# Patient Record
Sex: Female | Born: 1975 | Race: White | Hispanic: No | Marital: Single | Smoking: Current every day smoker
Health system: Southern US, Community
[De-identification: ages and names within clinical notes are randomized; demographics above are authoritative.]

## PROBLEM LIST (undated history)

## (undated) DIAGNOSIS — N289 Disorder of kidney and ureter, unspecified: Secondary | ICD-10-CM

## (undated) HISTORY — PX: APPENDECTOMY: SHX54

## (undated) HISTORY — PX: CHOLECYSTECTOMY: SHX55

---

## 2020-11-18 ENCOUNTER — Ambulatory Visit (HOSPITAL_COMMUNITY)
Admission: EM | Admit: 2020-11-18 | Discharge: 2020-11-18 | Disposition: A | Discharge status: Home / Self Care | Payer: No Payment, Other | Attending: Psychiatry | Admitting: Psychiatry

## 2020-11-18 ENCOUNTER — Other Ambulatory Visit: Payer: Self-pay

## 2020-11-18 DIAGNOSIS — F419 Anxiety disorder, unspecified: Secondary | ICD-10-CM | POA: Insufficient documentation

## 2020-11-18 DIAGNOSIS — F17210 Nicotine dependence, cigarettes, uncomplicated: Secondary | ICD-10-CM | POA: Insufficient documentation

## 2020-11-18 DIAGNOSIS — F331 Major depressive disorder, recurrent, moderate: Secondary | ICD-10-CM | POA: Insufficient documentation

## 2020-11-18 DIAGNOSIS — Z9151 Personal history of suicidal behavior: Secondary | ICD-10-CM | POA: Insufficient documentation

## 2020-11-18 NOTE — Discharge Instructions (Signed)

## 2020-11-18 NOTE — Progress Notes (Signed)
   11/18/20 2249  [facility] Triage Screening (Walk-ins at [facility] only)  How Did You Hear About [place]? Family/Friend  What Is the Reason for Your Visit/Call Today? Megan Lloyd is a 45 year old female presenting voluntary as a walk-in to [facility] requesting counseling for depression. Patient is accompanied by her daughter [name], [phone]. Patient reported counseling is long overdue. Patient reported ongoing depression for a long time and reported "bad past of childhood trauma". 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 Patient reported worsening depressive symptoms of crying spells, no confidence and inability to get out of bed. Patient reported moving from [place] to [place] 6 months ago to help daughter and son-in-law with new baby. Patient reported happiness with grandson. However patient feels that in order to be healthy that she needs outpatient mental health resources, such as therapy and medication management.  How Long Has This Been Causing You Problems? > than 6 months  Have You Recently Had Any Thoughts About Hurting Yourself? No  Are You Planning to Commit Suicide/Harm Yourself At This time? No  Have you Recently Had Thoughts About Hurting Someone Karolee Ohs? No  Are You Planning To Harm Someone At This Time? No  Are you currently experiencing any auditory, visual or other hallucinations? No  Have You Used Any Alcohol or Drugs in the Past 24 Hours? No  Do you have any current medical co-morbidities that require immediate attention? No  Clinician description of patient physical appearance/behavior: normal  What Do You Feel Would Help You the Most Today? Treatment for Depression or other mood problem  If access to [facility] was not available, would you have sought care in the Emergency Department? No  Determination of Need Routine (7 days)  Options For Referral Outpatient Therapy;Medication Management

## 2020-11-18 NOTE — ED Provider Notes (Addendum)
Behavioral Health Urgent Care Medical Screening Exam  Patient Name: Megan Lloyd MRN: [id number] Date of Evaluation: 11/18/20 Chief Complaint:  Depressed, Anxious  Diagnosis:  Final diagnoses:  MDD (major depressive disorder), recurrent episode, moderate (HCC)  Anxiety    History of Present illness: Megan Lloyd is a 45 y.o. female with past psychiatric history significant for depression and anxiety, as well as polysubstance abuse and alcohol abuse, who presents to the behavioral health urgent care as a voluntary walk-in accompanied by her daughter [name]: [phone]) for depression and anxiety.  With patient's consent, patient's daughter present during the assessment information was obtained from both the patient and patient's daughter for this assessment with patient's consent.  Patient reports that she has struggled with depression and anxiety for many years, but patient states that her depression and anxiety increased about 1 week ago when something dramatic happened at her work 1 week ago.  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  She reports that she cried yesterday on 11/17/2020 for the first time in a long time.  She reports that she used to have issues with substance abuse, in which patient states she used to abuse meth and cocaine, but patient states that she has been completely sober from all illicit drugs for the past 7 years.  Patient states that as soon as she stopped using illicit drugs 7 years ago, she then began abusing alcohol, stating that she was drinking "at least 20 shots daily" of alcohol for about 7 years.  Patient states that she has been completely sober from alcohol as well for the past 6 months now.  Patient denies any alcohol or illicit drug use currently.  She denies any history of seizures.   Patient does endorse smoking greater than or equal to 1 pack/day of cigarettes patient states that since she has been sober from drugs and alcohol, her main issues have been her depression and anxiety.  Patient denies SI currently on exam.  She denies experiencing any SI recently over the past few months.  Patient's daughter denies the patient expressing any suicidal statements to her over the past few months.  Patient reports 1 past suicide attempt 7 to 8 years ago, in which patient states that she took a gun, put the gun in her mouth, and pulled the trigger 3 times, but patient states that the gun did not fire on any of those 3 times.  Patient denies any history of self-injurious behavior via intentionally cutting or burning herself.  Patient denies HI, AVH, paranoia, or delusions.  Patient does endorse having a negative voice in her head intermittently that she refers to as negative self talk/negative self doubt and she states that this negativity is not auditory hallucinations.  Patient reports sleeping fairly, about 7 hours per night.  She endorses anhedonia as well as feelings of guilt, hopelessness, and worthlessness.  Patient states that she has been fatigued over the past few months but she states that her energy has been improving recently.  Patient endorses chronic issues with concentration and focus.  She reports decreased appetite as well as a 30 pound weight loss over the past 6 months, the patient states that this weight loss was intentional due to her being overweight in the past and she states that this weight loss was not attributed to her mental health.  Patient states that she last saw a psychiatrist about 15 to 20 years ago and she states that she was diagnosed with bipolar disorder at that time.  Patient states that she thinks she was falsely diagnosed at that time and thinks that she was diagnosed with bipolar at that time due to her substance abuse and impulsivity at that time.  Per my  chart review, I am unable to find any documented diagnosis of bipolar for the patient.  Patient endorses being easily distracted at times.  She denies any behaviors of indiscretions such as excessive spending/shopping sprees or other risky behaviors at this time.  She denies thoughts of grandiosity, but does endorse occasional flight of ideas.  Patient denies any additional symptoms of mania.  Patient does not appear to be hypomanic or manic on exam.  Patient states that she does not currently have a psychiatrist or therapist and she states that she has not taken any psychotropic medications for about 10 years.  She reports that she has taken Prozac, Wellbutrin, and Seroquel in the past, but she is unable to recall the dosages of these medications.  She states that Wellbutrin was somewhat helpful, Prozac was not helpful, and Seroquel was helpful for her mental health.  Patient states that she recently started taking St. John's wort.  She reports that she was hospitalized psychiatrically many times in the past and she states that her most recent psychiatric hospitalization was about 6 to 7 years ago in [place].  Patient currently lives in [place] with her daughter, son-in-law, and 10-month-old maternal grandson.  She states that she moved to [place] about 6 months ago from [place] in order to live with her daughter.  Patient and patient's daughter deny any access to firearms or weapons in patient's daughter's home.  Patient verbally contracts for safety with this Clinical research associate.  Psychiatric Specialty Exam  Presentation  General Appearance:Appropriate for Environment; Well Groomed  Eye Contact:Good  Speech:Clear and Coherent; Normal Rate  Speech Volume:Normal  Handedness:No data recorded  Mood and Affect  Mood:Anxious; Depressed  Affect:Congruent; Appropriate   Thought Process  Thought Processes:Coherent; Goal Directed; Linear  Descriptions of  Associations:Intact  Orientation:Full (Time, Place and Person)  Thought Content:WDL; Logical    Hallucinations:None  Ideas of Reference:None  Suicidal Thoughts:No  Homicidal Thoughts:No   Sensorium  Memory:Immediate Good; Recent Good; Remote Good  Judgment:Good  Insight:Good   Executive Functions  Concentration:Good  Attention Span:Good  Recall:Good  Fund of Knowledge:Good  Language:Good   Psychomotor Activity  Psychomotor Activity:Normal   Assets  Assets:Communication Skills; Desire for Improvement; Housing; Leisure Time; Physical Health; Resilience; Social Support   Sleep  Sleep:Fair  Number of hours:  No data recorded  No data recorded  Physical Exam: Physical Exam Vitals reviewed.  Constitutional:      General: She is not in acute distress.    Appearance: Normal appearance. She is not ill-appearing, toxic-appearing or diaphoretic.  HENT:     Head: Normocephalic and atraumatic.     Right Ear: External ear normal.     Left Ear: External ear normal.     Nose: Nose normal.  Eyes:     General:        Right eye: No discharge.        Left eye: No discharge.     Conjunctiva/sclera: Conjunctivae normal.  Cardiovascular:     Rate and Rhythm: Normal rate.  Pulmonary:     Effort: Pulmonary effort is normal. No respiratory distress.  Musculoskeletal:  General: Normal range of motion.     Cervical back: Normal range of motion.  Neurological:     General: No focal deficit present.     Mental Status: She is alert and oriented to person, place, and time.     Comments: No tremor noted.   Psychiatric:        Attention and Perception: She does not perceive auditory or visual hallucinations.        Mood and Affect: Mood is anxious and depressed.        Speech: Speech normal.        Behavior: Behavior normal. Behavior is not agitated, slowed, aggressive, withdrawn, hyperactive or combative. Behavior is cooperative.        Thought Content: Thought  content is not paranoid or delusional. Thought content does not include homicidal or suicidal ideation.     Comments: Affect mood congruent.    Review of Systems  Constitutional:  Positive for malaise/fatigue and weight loss. Negative for chills, diaphoresis and fever.  HENT:  Negative for congestion.   Respiratory:  Negative for cough and shortness of breath.   Cardiovascular:  Negative for chest pain and palpitations.  Gastrointestinal:  Negative for abdominal pain, constipation, diarrhea, nausea and vomiting.  Musculoskeletal:  Negative for joint pain and myalgias.  Neurological:  Negative for dizziness and headaches.  Psychiatric/Behavioral:  Positive for depression. Negative for hallucinations, memory loss, substance abuse and suicidal ideas. The patient is nervous/anxious. The patient does not have insomnia.   All other systems reviewed and are negative.  Vitals: Blood pressure 132/90, pulse 77, temperature 98.1 F (36.7 C), temperature source Oral, resp. rate 18, SpO2 96 %. There is no height or weight on file to calculate BMI.  Musculoskeletal: Strength & Muscle Tone: within normal limits Gait & Station: normal Patient leans: N/A   [facility] MSE Discharge Disposition for Follow up and Recommendations: Based on my evaluation the patient does not appear to have an emergency medical condition and can be discharged with resources and follow up care in outpatient services for Medication Management, Individual Therapy, and Group Therapy  Patient denies SI, HI, AVH, paranoia, or delusions.  Patient is not psychotic on exam.  Patient is not an imminent threat/risk to herself or others.  Patient does not meet inpatient psychiatric treatment criteria, overnight observation criteria, or [facility] FBC criteria.  Patient is not currently established with an outpatient psychiatrist or therapist and is not taking any psychotropic medications at this time.  Patient states that she would like to become  established with an outpatient psychiatrist and therapist and I agree with this. [place] in [place] and does not have health insurance.  Thus, patient [facility] criteria [facility].  [facility] open access/walk-in hours for psychiatry/medication management and therapy appointments in order to become established with outpatient psychiatric providers.  Patient is agreeable to going [facility] for psychiat[facility] and therapy.  [facility] Monday through Friday open access/walk-in schedule/hours provided to the patient.  Additional list of outpatient [facility] and therapy facilities provided to the patient for the patient to utilize to schedule psychiatry and therapy appointments in the case that patient is unable to [facility].  Patient verbally contracts for safety with this Clinical research associatewriter.  Patient states that if she is to return  home this evening, she will not try to harm or kill herself.  Patient's daughter does not have any safety concerns regarding the patient returning home with her at this time.  Patient's daughter states that she feels safe having the patient return home with her at this time.  Safety planning done at length with the patient and patient's daughter regarding appropriate actions to take/resources to utilize [facility], nearest ED, 911, suicide prevention Lifeline) if the patient becomes suicidal or homicidal, if the patient's condition rapidly deteriorates/worsens/does not improve, or if the patient begins to experience a mental health crisis.  Patient and patient's daughter verbalized understanding and agreement of the overall plan and recommendations, including safety plan. All patients and patient's  daughter's questions answered and concerns addressed. Patient discharged home with her daughter.   Jaclyn Shaggy, PA-C 11/18/2020, 9:51 PM

## 2020-11-20 ENCOUNTER — Ambulatory Visit (INDEPENDENT_AMBULATORY_CARE_PROVIDER_SITE_OTHER): Payer: No Payment, Other | Admitting: Physician Assistant

## 2020-11-20 ENCOUNTER — Other Ambulatory Visit: Payer: Self-pay

## 2020-11-20 ENCOUNTER — Encounter (HOSPITAL_COMMUNITY): Payer: Self-pay | Admitting: Physician Assistant

## 2020-11-20 ENCOUNTER — Ambulatory Visit (HOSPITAL_COMMUNITY): Payer: No Payment, Other | Admitting: Clinical

## 2020-11-20 VITALS — BP 137/97 | HR 68 | Ht 69.0 in | Wt 204.0 lb

## 2020-11-20 DIAGNOSIS — F3130 Bipolar disorder, current episode depressed, mild or moderate severity, unspecified: Secondary | ICD-10-CM | POA: Insufficient documentation

## 2020-11-20 DIAGNOSIS — F411 Generalized anxiety disorder: Secondary | ICD-10-CM

## 2020-11-20 DIAGNOSIS — G479 Sleep disorder, unspecified: Secondary | ICD-10-CM | POA: Diagnosis not present

## 2020-11-20 MED ORDER — HYDROXYZINE HCL 10 MG PO TABS
10.0000 mg | ORAL_TABLET | Freq: Three times a day (TID) | ORAL | 1 refills | Status: DC | PRN
Start: 2020-11-20 — End: 2020-11-27
  Filled 2020-11-20: qty 75, 25d supply, fill #0

## 2020-11-20 MED ORDER — PRAZOSIN HCL 1 MG PO CAPS
1.0000 mg | ORAL_CAPSULE | Freq: Every day | ORAL | 1 refills | Status: DC
  Filled 2020-11-20: qty 30, 30d supply, fill #0

## 2020-11-20 MED ORDER — QUETIAPINE FUMARATE 50 MG PO TABS
50.0000 mg | ORAL_TABLET | Freq: Every day | ORAL | 1 refills | Status: DC
  Filled 2020-11-20: qty 30, 30d supply, fill #0
  Filled 2020-12-23: qty 30, 30d supply, fill #1

## 2020-11-20 MED ORDER — BUPROPION HCL ER (XL) 150 MG PO TB24
150.0000 mg | ORAL_TABLET | ORAL | 1 refills | Status: DC
  Filled 2020-11-20: qty 30, 30d supply, fill #0
  Filled 2020-12-23: qty 30, 30d supply, fill #1
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
## 2020-11-23 ENCOUNTER — Encounter (HOSPITAL_COMMUNITY): Payer: Self-pay | Admitting: Physician Assistant

## 2020-11-25 ENCOUNTER — Telehealth (HOSPITAL_COMMUNITY): Payer: Self-pay | Admitting: Professional

## 2020-11-25 ENCOUNTER — Encounter (HOSPITAL_COMMUNITY): Payer: Self-pay | Admitting: Licensed Clinical Social Worker

## 2020-11-25 ENCOUNTER — Ambulatory Visit (INDEPENDENT_AMBULATORY_CARE_PROVIDER_SITE_OTHER): Payer: No Payment, Other | Admitting: Licensed Clinical Social Worker

## 2020-11-25 ENCOUNTER — Other Ambulatory Visit: Payer: Self-pay

## 2020-11-25 DIAGNOSIS — F3130 Bipolar disorder, current episode depressed, mild or moderate severity, unspecified: Secondary | ICD-10-CM
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
## 2020-11-25 NOTE — Telephone Encounter (Signed)
Cln called to f/u on referral for PHP per Megan Lloyd orients pt to group. Pt reports being unable to make it to in person 5x a week. Cln tells pt group is virtual at this time, pt then agrees to assessment. Pt cannot remember email address at this time and has grandson crying so asks for call back in about 1 hour. Cln agrees.

## 2020-11-26 ENCOUNTER — Telehealth (HOSPITAL_COMMUNITY): Payer: Self-pay | Admitting: Professional

## 2020-11-26 ENCOUNTER — Ambulatory Visit (HOSPITAL_COMMUNITY): Payer: No Payment, Other | Admitting: Professional

## 2020-11-26 DIAGNOSIS — F3130 Bipolar disorder, current episode depressed, mild or moderate severity, unspecified: Secondary | ICD-10-CM

## 2020-11-26 DIAGNOSIS — F411 Generalized anxiety disorder: Secondary | ICD-10-CM
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
## 2020-11-27 ENCOUNTER — Telehealth (HOSPITAL_COMMUNITY): Payer: Self-pay | Admitting: Professional

## 2020-11-27 ENCOUNTER — Other Ambulatory Visit: Payer: Self-pay

## 2020-11-27 ENCOUNTER — Ambulatory Visit (HOSPITAL_COMMUNITY): Payer: No Payment, Other | Admitting: Professional

## 2020-11-27 ENCOUNTER — Ambulatory Visit (HOSPITAL_COMMUNITY): Payer: No Payment, Other

## 2020-11-27 ENCOUNTER — Encounter (HOSPITAL_COMMUNITY): Payer: Self-pay | Admitting: Professional

## 2020-11-27 DIAGNOSIS — F411 Generalized anxiety disorder: Secondary | ICD-10-CM

## 2020-11-27 DIAGNOSIS — F3130 Bipolar disorder, current episode depressed, mild or moderate severity, unspecified: Secondary | ICD-10-CM

## 2020-11-27 MED ORDER — HYDROXYZINE HCL 25 MG PO TABS
25.0000 mg | ORAL_TABLET | Freq: Three times a day (TID) | ORAL | 0 refills | Status: DC | PRN
Start: 2020-11-27 — End: 2021-01-10
  Filled 2020-11-27: qty 30, 10d supply, fill #0

## 2020-11-27 NOTE — Telephone Encounter (Signed)
See call log 
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
## 2020-11-28 ENCOUNTER — Ambulatory Visit (HOSPITAL_COMMUNITY): Payer: No Payment, Other

## 2020-11-28 NOTE — Psych (Signed)
Virtual Visit via Video Note  I connected with [name] on 11/27/20 at  9:00 AM EDT by a video enabled telemedicine application and verified that I am speaking with the correct person using two identifiers.  Location: Patient: Home Provider: Clinical Home Office   I discussed the limitations of evaluation and management by telemedicine and the availability of in person appointments. The patient expressed understanding and agreed to proceed.  Follow Up Instructions:    I discussed the assessment and treatment plan with the patient. The patient was provided an opportunity to ask questions and all were answered. The patient agreed with the plan and demonstrated an understanding of the instructions.   The patient was advised to call back or seek an in-person evaluation if the symptoms worsen or if the condition fails to improve as anticipated.  I provided 32 minutes of non-face-to-face time during this encounter.   Quinn Axe, LCMHCA    Pt arrived for group in the virtual setting. Pt completed check-in: Patient rated her mood at a 5 (1 being low and 10 being high) due to anxiety about group. Pt rates current anxiety as 4 and depression as 4 (1 being low and 10 being high). Pt lost connectivity and did not return. Cln called pt and pt reports laptop died and she is unaware of how to charge it. Her daughter has helped her sign on to the virtual setting but is not home at the time. Pt denies SI/HI and reports she is going to take a nap and walk. Pt reports she will try to attend group tomorrow. Pt knows to reach out to daughter or ED if SI occurs.  Milana Na, [facility] 11/27/20

## 2020-11-29 ENCOUNTER — Telehealth (HOSPITAL_COMMUNITY): Payer: Self-pay | Admitting: Professional

## 2020-11-29 ENCOUNTER — Ambulatory Visit (HOSPITAL_COMMUNITY): Payer: No Payment, Other

## 2020-11-29 NOTE — Telephone Encounter (Signed)
See call log 

## 2020-12-02 ENCOUNTER — Ambulatory Visit (HOSPITAL_COMMUNITY): Payer: No Payment, Other

## 2020-12-03 ENCOUNTER — Ambulatory Visit (HOSPITAL_COMMUNITY): Payer: No Payment, Other

## 2020-12-04 ENCOUNTER — Ambulatory Visit (HOSPITAL_COMMUNITY): Payer: No Payment, Other

## 2020-12-04 ENCOUNTER — Other Ambulatory Visit: Payer: Self-pay
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
## 2020-12-05 ENCOUNTER — Ambulatory Visit (HOSPITAL_COMMUNITY): Payer: No Payment, Other

## 2020-12-06 ENCOUNTER — Ambulatory Visit (HOSPITAL_COMMUNITY): Payer: No Payment, Other

## 2020-12-09 ENCOUNTER — Ambulatory Visit (HOSPITAL_COMMUNITY): Payer: No Payment, Other

## 2020-12-10 ENCOUNTER — Ambulatory Visit (HOSPITAL_COMMUNITY): Payer: No Payment, Other

## 2020-12-11 ENCOUNTER — Ambulatory Visit (HOSPITAL_COMMUNITY): Payer: No Payment, Other

## 2020-12-19 ENCOUNTER — Other Ambulatory Visit: Payer: Self-pay

## 2020-12-23 ENCOUNTER — Other Ambulatory Visit: Payer: Self-pay

## 2021-01-10 ENCOUNTER — Encounter (HOSPITAL_COMMUNITY): Payer: Self-pay | Admitting: Physician Assistant

## 2021-01-10 ENCOUNTER — Other Ambulatory Visit: Payer: Self-pay

## 2021-01-10 ENCOUNTER — Telehealth (INDEPENDENT_AMBULATORY_CARE_PROVIDER_SITE_OTHER): Payer: No Payment, Other | Admitting: Physician Assistant

## 2021-01-10 DIAGNOSIS — G479 Sleep disorder, unspecified: Secondary | ICD-10-CM | POA: Diagnosis not present

## 2021-01-10 DIAGNOSIS — F3130 Bipolar disorder, current episode depressed, mild or moderate severity, unspecified: Secondary | ICD-10-CM

## 2021-01-10 DIAGNOSIS — F411 Generalized anxiety disorder: Secondary | ICD-10-CM

## 2021-01-10 MED ORDER — QUETIAPINE FUMARATE 50 MG PO TABS
50.0000 mg | ORAL_TABLET | Freq: Every day | ORAL | 1 refills | Status: DC
Start: 2021-01-10 — End: 2021-03-27
  Filled 2021-01-10 – 2021-01-23 (×2): qty 30, 30d supply, fill #0
  Filled 2021-02-24: qty 30, 30d supply, fill #1

## 2021-01-10 MED ORDER — BUPROPION HCL ER (XL) 150 MG PO TB24
150.0000 mg | ORAL_TABLET | ORAL | 1 refills | Status: DC
  Filled 2021-01-10 – 2021-01-23 (×2): qty 30, 30d supply, fill #0
  Filled 2021-02-24: qty 30, 30d supply, fill #1

## 2021-01-10 MED ORDER — HYDROXYZINE HCL 25 MG PO TABS
25.0000 mg | ORAL_TABLET | Freq: Three times a day (TID) | ORAL | 0 refills | Status: DC | PRN
  Filled 2021-01-10 – 2021-01-23 (×2): qty 30, 10d supply, fill #0

## 2021-01-10 MED ORDER — PRAZOSIN HCL 1 MG PO CAPS
1.0000 mg | ORAL_CAPSULE | Freq: Every day | ORAL | 1 refills | Status: DC
  Filled 2021-01-10 – 2021-01-23 (×2): qty 30, 30d supply, fill #0
  Filled 2021-02-24: qty 30, 30d supply, fill #1

## 2021-01-10 NOTE — Progress Notes (Addendum)
BH MD/PA/NP OP Progress Note  Virtual Visit via Telephone Note  I connected with Megan Lloyd on 01/10/21 at  4:30 PM EDT by telephone and verified that I am speaking with the correct person using two identifiers.  Location: Patient: Home Provider: Clinic   I discussed the limitations, risks, security and privacy concerns of performing an evaluation and management service by telephone and the availability of in person appointments. I also discussed with the patient that there may be a patient responsible charge related to this service. The patient expressed understanding and agreed to proceed.  Follow Up Instructions:  I discussed the assessment and treatment plan with the patient. The patient was provided an opportunity to ask questions and all were answered. The patient agreed with the plan and demonstrated an understanding of the instructions.   The patient was advised to call back or seek an in-person evaluation if the symptoms worsen or if the condition fails to improve as anticipated.  I provided 17 minutes of non-face-to-face time during this encounter.  Megan Hatchet, PA   01/10/2021 9:32 PM Megan Lloyd  MRN:  [id number]  Chief Complaint: Follow up and medication management  HPI:   Megan Lloyd is a 45 year old female with a past psychiatric history significant for bipolar disorder, sleep disturbances, and generalized anxiety disorder who presents to [facility] via virtual telephone visit for follow-up and medication management.  Patient is currently being managed on the following medications:  Seroquel 50 mg at bedtime Bupropion (Wellbutrin XL) 150 mg 24-hour tablet daily Hydroxyzine 25 mg 3 times daily as needed Prazosin 1 mg at bedtime  Patient reports that her medications seem to be working well and that she has no issues or concerns.  Patient also notes that she has limited her amount of smoking since being placed  on Wellbutrin.  She reports that she has not had a breakdown in roughly 3 weeks.  Patient still endorses depressive symptoms but attributes her depression to recently starting a new job at [employer].  Port that her anxiety has decreased a lot and has been doing much better.  Patient reports that she is able to recognize triggers in her life and handle them accordingly.  Patient denies any new stressors and and states that she has nothing negative to report.  Patient is alert and oriented x4, calm, cooperative, and fully engaged in conversation during the encounter.  Patient states that she is happy.  Patient denies suicidal or homicidal ideations.  She further denies auditory or visual hallucinations and does not appear to be responding to internal/external stimuli.  Patient endorses good sleep and receives on average 6 to 8 hours of sleep each night.  Patient endorses good appetite and eats on average 1-2 meals per day.  Patient states that she has also started incorporating exercise into her regimen.  Patient denies alcohol consumption and illicit drug use.  Patient endorses tobacco use and smokes on average half pack per day which has decreased from her usual amount.  Visit Diagnosis:    ICD-10-CM   1. Bipolar affective disorder, current episode depressed, current episode severity unspecified (HCC)  F31.30 buPROPion (WELLBUTRIN XL) 150 MG 24 hr tablet    QUEtiapine (SEROQUEL) 50 MG tablet    2. Sleep disturbances  G47.9 prazosin (MINIPRESS) 1 MG capsule    QUEtiapine (SEROQUEL) 50 MG tablet    3. Generalized anxiety disorder  F41.1 hydrOXYzine (ATARAX/VISTARIL) 25 MG tablet      Past Psychiatric  History:  Bipolar disorder Sleep disturbances Generalized anxiety disorder  Past Medical History: History reviewed. No pertinent past medical history. History reviewed. No pertinent surgical history.  Family Psychiatric History:  Patient is unsure of family psychiatric history. She states that she  never knew her mom that well but states that she knows she had a lot of issues. She reports that her father was bat-shit crazy and used to kill their pets.  Family History: History reviewed. No pertinent family history.  Social History:  Social History   Socioeconomic History   Marital status: Single    Spouse name: Not on file   Number of children: Not on file   Years of education: Not on file   Highest education level: Not on file  Occupational History   Not on file  Tobacco Use   Smoking status: Every Day    Packs/day: 1.00    Types: Cigarettes   Smokeless tobacco: Never  Substance and Sexual Activity   Alcohol use: Not Currently   Drug use: Not Currently   Sexual activity: Not Currently  Other Topics Concern   Not on file  Social History Narrative   Not on file   Social Determinants of Health   Financial Resource Strain: Low Risk    Difficulty of Paying Living Expenses: Not hard at all  Food Insecurity: No Food Insecurity   Worried About Programme researcher, broadcasting/film/video in the Last Year: Never true   Ran Out of Food in the Last Year: Never true  Transportation Needs: No Transportation Needs   Lack of Transportation (Medical): No   Lack of Transportation (Non-Medical): No  Physical Activity: Sufficiently Active   Days of Exercise per Week: 7 days   Minutes of Exercise per Session: 60 min  Stress: Stress Concern Present   Feeling of Stress : Very much  Social Connections: Socially Isolated   Frequency of Communication with Friends and Family: Three times a week   Frequency of Social Gatherings with Friends and Family: Twice a week   Attends Religious Services: Never   Database administrator or Organizations: No   Attends Engineer, structural: Never   Marital Status: Never married    Allergies: Not on File  Metabolic Disorder Labs: No results found for: HGBA1C, MPG No results found for: PROLACTIN No results found for: CHOL, TRIG, HDL, CHOLHDL, VLDL, LDLCALC No  results found for: TSH  Therapeutic Level Labs: No results found for: LITHIUM No results found for: VALPROATE No components found for:  CBMZ  Current Medications: Current Outpatient Medications  Medication Sig Dispense Refill   buPROPion (WELLBUTRIN XL) 150 MG 24 hr tablet Take 1 tablet (150 mg total) by mouth every morning. 30 tablet 1   hydrOXYzine (ATARAX/VISTARIL) 25 MG tablet Take 1 tablet (25 mg total) by mouth 3 (three) times daily as needed for anxiety. 30 tablet 0   prazosin (MINIPRESS) 1 MG capsule Take 1 capsule (1 mg total) by mouth at bedtime. 30 capsule 1   QUEtiapine (SEROQUEL) 50 MG tablet Take 1 tablet (50 mg total) by mouth at bedtime. 30 tablet 1   No current facility-administered medications for this visit.     Musculoskeletal: Strength & Muscle Tone: Unable to assess due to telemedicine visit Gait & Station: Unable to assess due to telemedicine visit Patient leans: Unable to assess due to telemedicine visit  Psychiatric Specialty Exam: Review of Systems  Psychiatric/Behavioral:  Negative for decreased concentration, dysphoric mood, hallucinations, self-injury, sleep disturbance and suicidal  ideas. The patient is not nervous/anxious and is not hyperactive.    There were no vitals taken for this visit.There is no height or weight on file to calculate BMI.  General Appearance: Unable to assess due to telemedicine visit  Eye Contact:  Unable to assess due to telemedicine visit  Speech:  Clear and Coherent and Normal Rate  Volume:  Normal  Mood:  Anxious and Euthymic  Affect:  Appropriate and Congruent  Thought Process:  Coherent and Descriptions of Associations: Intact  Orientation:  Full (Time, Place, and Person)  Thought Content: WDL   Suicidal Thoughts:  No  Homicidal Thoughts:  No  Memory:  Immediate;   Good Recent;   Good Remote;   Fair  Judgement:  Good  Insight:  Fair  Psychomotor Activity:  Restlessness  Concentration:  Concentration: Good and  Attention Span: Good  Recall:  Good  Fund of Knowledge: Good  Language: Good  Akathisia:  NA  Handed:  Right  AIMS (if indicated): not done  Assets:  Communication Skills Desire for Improvement Housing Social Support  ADL's:  Intact  Cognition: WNL  Sleep:  Good   Screenings: GAD-7    Flowsheet Row Video Visit from 01/10/2021 in [facility] Office Visit from 11/20/2020 in [facility]  Total GAD-7 Score 11 21      PHQ2-9    Flowsheet Row Video Visit from 01/10/2021 in [facility] Counselor from 11/25/2020 in [facility] Office Visit from 11/20/2020 in [facility]  PHQ-2 Total Score 3 6 4   PHQ-9 Total Score 14 26 24       Flowsheet Row Video Visit from 01/10/2021 in [facility] Counselor from 11/25/2020 in [facility] Office Visit from 11/20/2020 in [facility]  C-SSRS RISK CATEGORY Low Risk High Risk Moderate Risk        Assessment and Plan:   Megan Lloyd is a 45 year old female with a past psychiatric history significant for bipolar disorder, sleep disturbances, and generalized anxiety disorder who presents to [facility] via virtual telephone visit for follow-up and medication management.  Patient reports no issues or concerns regarding her current medication regimen.  Patient reports that her medications have been working well and that they have been effective in managing her depression and anxiety.  Patient is requesting refills on all her medications following the conclusion of the encounter.  Patient's medications to be e-prescribed to pharmacy of choice.  1. Bipolar affective disorder, current episode depressed, current episode severity unspecified (HCC)  - buPROPion (WELLBUTRIN XL) 150 MG 24 hr tablet;  Take 1 tablet (150 mg total) by mouth every morning.  Dispense: 30 tablet; Refill: 1 - QUEtiapine (SEROQUEL) 50 MG tablet; Take 1 tablet (50 mg total) by mouth at bedtime.  Dispense: 30 tablet; Refill: 1  2. Sleep disturbances  - prazosin (MINIPRESS) 1 MG capsule; Take 1 capsule (1 mg total) by mouth at bedtime.  Dispense: 30 capsule; Refill: 1 - QUEtiapine (SEROQUEL) 50 MG tablet; Take 1 tablet (50 mg total) by mouth at bedtime.  Dispense: 30 tablet; Refill: 1  3. Generalized anxiety disorder  - hydrOXYzine (ATARAX/VISTARIL) 25 MG tablet; Take 1 tablet (25 mg total) by mouth 3 (three) times daily as needed for anxiety.  Dispense: 30 tablet; Refill: 0  Patient to follow up in 2 months Provider spent a total of 17  minutes with the patient/reviewing the patient's chart  Megan Hatchet, PA 01/10/2021, 9:32 PM

## 2021-01-13 ENCOUNTER — Other Ambulatory Visit: Payer: Self-pay

## 2021-01-20 ENCOUNTER — Other Ambulatory Visit: Payer: Self-pay

## 2021-01-22 ENCOUNTER — Ambulatory Visit (INDEPENDENT_AMBULATORY_CARE_PROVIDER_SITE_OTHER): Payer: No Payment, Other | Admitting: Licensed Clinical Social Worker

## 2021-01-22 DIAGNOSIS — F3132 Bipolar disorder, current episode depressed, moderate: Secondary | ICD-10-CM

## 2021-01-23 ENCOUNTER — Other Ambulatory Visit: Payer: Self-pay
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
## 2021-02-24 ENCOUNTER — Other Ambulatory Visit: Payer: Self-pay

## 2021-02-25 ENCOUNTER — Ambulatory Visit (HOSPITAL_COMMUNITY): Payer: No Payment, Other | Admitting: Licensed Clinical Social Worker

## 2021-02-26 ENCOUNTER — Other Ambulatory Visit: Payer: Self-pay

## 2021-03-14 ENCOUNTER — Telehealth (HOSPITAL_COMMUNITY): Payer: No Payment, Other | Admitting: Physician Assistant

## 2021-03-25 ENCOUNTER — Other Ambulatory Visit: Payer: Self-pay

## 2021-03-26 ENCOUNTER — Ambulatory Visit (HOSPITAL_COMMUNITY): Payer: Self-pay | Admitting: Licensed Clinical Social Worker

## 2021-03-27 ENCOUNTER — Telehealth (INDEPENDENT_AMBULATORY_CARE_PROVIDER_SITE_OTHER): Payer: No Payment, Other | Admitting: Physician Assistant

## 2021-03-27 ENCOUNTER — Other Ambulatory Visit: Payer: Self-pay

## 2021-03-27 ENCOUNTER — Encounter (HOSPITAL_COMMUNITY): Payer: Self-pay | Admitting: Physician Assistant

## 2021-03-27 DIAGNOSIS — G479 Sleep disorder, unspecified: Secondary | ICD-10-CM

## 2021-03-27 DIAGNOSIS — F3130 Bipolar disorder, current episode depressed, mild or moderate severity, unspecified: Secondary | ICD-10-CM

## 2021-03-27 DIAGNOSIS — F411 Generalized anxiety disorder: Secondary | ICD-10-CM | POA: Diagnosis not present

## 2021-03-27 MED ORDER — QUETIAPINE FUMARATE 100 MG PO TABS
100.0000 mg | ORAL_TABLET | Freq: Every day | ORAL | 1 refills | Status: DC
  Filled 2021-03-27 – 2021-05-19 (×3): qty 30, 30d supply, fill #0

## 2021-03-27 MED ORDER — HYDROXYZINE HCL 25 MG PO TABS
25.0000 mg | ORAL_TABLET | Freq: Three times a day (TID) | ORAL | 1 refills | Status: DC | PRN
  Filled 2021-03-27: qty 75, 25d supply, fill #0

## 2021-03-27 MED ORDER — QUETIAPINE FUMARATE 50 MG PO TABS
50.0000 mg | ORAL_TABLET | Freq: Every day | ORAL | 1 refills | Status: DC
  Filled 2021-03-27 – 2021-05-19 (×3): qty 30, 30d supply, fill #0

## 2021-03-27 MED ORDER — PRAZOSIN HCL 1 MG PO CAPS
1.0000 mg | ORAL_CAPSULE | Freq: Every day | ORAL | 1 refills | Status: DC
  Filled 2021-03-27: qty 30, 30d supply, fill #0

## 2021-03-27 MED ORDER — BUPROPION HCL ER (XL) 150 MG PO TB24
150.0000 mg | ORAL_TABLET | ORAL | 1 refills | Status: DC
  Filled 2021-03-27 – 2021-05-19 (×3): qty 30, 30d supply, fill #0

## 2021-03-27 NOTE — Progress Notes (Addendum)
BH MD/PA/NP OP Progress Note  Virtual Visit via Telephone Note  I connected with Megan Lloyd on 03/27/21 at  2:00 PM EST by telephone and verified that I am speaking with the correct person using two identifiers.  Location: Patient: Virtual Provider: Secure area at work   I discussed the limitations, risks, security and privacy concerns of performing an evaluation and management service by telephone and the availability of in person appointments. I also discussed with the patient that there may be a patient responsible charge related to this service. The patient expressed understanding and agreed to proceed.  Follow Up Instructions:   I discussed the assessment and treatment plan with the patient. The patient was provided an opportunity to ask questions and all were answered. The patient agreed with the plan and demonstrated an understanding of the instructions.   The patient was advised to call back or seek an in-person evaluation if the symptoms worsen or if the condition fails to improve as anticipated.  I provided 17 minutes of non-face-to-face time during this encounter.  Meta Hatchet, PA    03/27/2021 3:04 PM Megan Lloyd  MRN:  [id number]  Chief Complaint: Follow up and medication management  HPI:   Megan Lloyd is a 45 year old female with a past psychiatric history significant for bipolar disorder, sleep disturbances, and generalized anxiety disorder who presents to [facility] via virtual telephone visit for follow-up and medication management.  Patient is currently being managed on the following medications:  Seroquel 50 mg at bedtime Bupropion (Wellbutrin XL) 150 mg 24-hour tablet daily Hydroxyzine 25 mg 3 times daily as needed Prazosin 1 mg at bedtime  Patient reports that she has been having a bit of a crisis related to family.  She reports that her son recently got hurt and tore his ACL.  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  Due to the surgery and the recovery time, patient will be out for the duration of the season.  Since receiving the news, patient reports that she has been extremely stressed and her anxiety has been very bad.  Patient rates her anxiety a 10+ out of 10.  She attributes missing 4 days of work due to her worsening anxiety and reports that she has been unable to get out of bed or eat regularly.  Patient endorses depression she attributes to her son receiving surgery on the 28th of this month.  She reports that she feels bad that she is unable to see her son due to her son attending school in [place].  Patient denies any other new stressors at this time.  A PHQ-9 screen was performed with the patient scoring an 18.  A GAD-7 screen was also performed with the patient scoring a 19.  Patient is alert and oriented x4, calm, cooperative, and fully engaged in conversation during the encounter.  Patient states that she is hopeful stating that she would like for her son to get better.  Patient denies suicidal or homicidal ideations.  She further denies auditory or visual hallucinations and does not appear to be responding to internal/external stimuli.  Patient endorses poor sleep and receives on average 3 to 4 hours of sleep each night.  Patient states that her sleep has been very disturbed and that she constantly wakes up.  Patient endorses decreased appetite and eats on average 1 meal or less each day.  Patient  denies alcohol consumption and illicit drug use.  Patient endorses tobacco use and smokes on average over a pack per day.  Visit Diagnosis:    ICD-10-CM   1. Bipolar affective disorder, current episode depressed, current episode severity unspecified (HCC)  F31.30 QUEtiapine (SEROQUEL) 100 MG tablet    buPROPion (WELLBUTRIN XL) 150 MG 24 hr tablet    2. Sleep disturbances  G47.9 QUEtiapine (SEROQUEL) 100 MG tablet     prazosin (MINIPRESS) 1 MG capsule    3. Generalized anxiety disorder  F41.1 QUEtiapine (SEROQUEL) 50 MG tablet    hydrOXYzine (ATARAX) 25 MG tablet      Past Psychiatric History:  Bipolar disorder Sleep disturbances Generalized anxiety disorder  Past Medical History: History reviewed. No pertinent past medical history. History reviewed. No pertinent surgical history.  Family Psychiatric History:  Patient is unsure of family psychiatric history. She states that she never knew her mom that well but states that she knows she had a lot of issues. She reports that her father was bat-shit crazy and used to kill their pets.  Family History: History reviewed. No pertinent family history.  Social History:  Social History   Socioeconomic History   Marital status: Single    Spouse name: Not on file   Number of children: Not on file   Years of education: Not on file   Highest education level: Not on file  Occupational History   Not on file  Tobacco Use   Smoking status: Every Day    Packs/day: 1.00    Types: Cigarettes   Smokeless tobacco: Never  Substance and Sexual Activity   Alcohol use: Not Currently   Drug use: Not Currently   Sexual activity: Not Currently  Other Topics Concern   Not on file  Social History Narrative   Not on file   Social Determinants of Health   Financial Resource Strain: Low Risk    Difficulty of Paying Living Expenses: Not hard at all  Food Insecurity: No Food Insecurity   Worried About Programme researcher, broadcasting/film/video in the Last Year: Never true   Ran Out of Food in the Last Year: Never true  Transportation Needs: No Transportation Needs   Lack of Transportation (Medical): No   Lack of Transportation (Non-Medical): No  Physical Activity: Sufficiently Active   Days of Exercise per Week: 7 days   Minutes of Exercise per Session: 60 min  Stress: Stress Concern Present   Feeling of Stress : Very much  Social Connections: Socially Isolated   Frequency of  Communication with Friends and Family: Three times a week   Frequency of Social Gatherings with Friends and Family: Twice a week   Attends Religious Services: Never   Database administrator or Organizations: No   Attends Engineer, structural: Never   Marital Status: Never married    Allergies: Not on File  Metabolic Disorder Labs: No results found for: HGBA1C, MPG No results found for: PROLACTIN No results found for: CHOL, TRIG, HDL, CHOLHDL, VLDL, LDLCALC No results found for: TSH  Therapeutic Level Labs: No results found for: LITHIUM No results found for: VALPROATE No components found for:  CBMZ  Current Medications: Current Outpatient Medications  Medication Sig Dispense Refill   QUEtiapine (SEROQUEL) 100 MG tablet Take 1 tablet (100 mg total) by mouth at bedtime. 30 tablet 1   buPROPion (WELLBUTRIN XL) 150 MG 24 hr tablet Take 1 tablet (150 mg total) by mouth every morning. 30 tablet 1  hydrOXYzine (ATARAX) 25 MG tablet Take 1 tablet (25 mg total) by mouth 3 (three) times daily as needed for anxiety. 75 tablet 1   prazosin (MINIPRESS) 1 MG capsule Take 1 capsule (1 mg total) by mouth at bedtime. 30 capsule 1   QUEtiapine (SEROQUEL) 50 MG tablet Take 1 tablet (50 mg total) by mouth daily. 30 tablet 1   No current facility-administered medications for this visit.     Musculoskeletal: Strength & Muscle Tone: Unable to assess due to telemedicine visit Gait & Station: Unable to assess due to telemedicine visit Patient leans: Unable to assess due to telemedicine visit  Psychiatric Specialty Exam: Review of Systems  Psychiatric/Behavioral:  Positive for sleep disturbance. Negative for decreased concentration, dysphoric mood, hallucinations, self-injury and suicidal ideas. The patient is nervous/anxious. The patient is not hyperactive.    There were no vitals taken for this visit.There is no height or weight on file to calculate BMI.  General Appearance: Unable to  assess due to telemedicine visit  Eye Contact:  Unable to assess due to telemedicine visit  Speech:  Clear and Coherent and Normal Rate  Volume:  Normal  Mood:  Anxious and Depressed  Affect:  Congruent and Depressed  Thought Process:  Coherent, Goal Directed, and Descriptions of Associations: Intact  Orientation:  Full (Time, Place, and Person)  Thought Content: WDL   Suicidal Thoughts:  No  Homicidal Thoughts:  No  Memory:  Immediate;   Good Recent;   Good Remote;   Fair  Judgement:  Good  Insight:  Fair  Psychomotor Activity:  Restlessness  Concentration:  Concentration: Good and Attention Span: Good  Recall:  Good  Fund of Knowledge: Good  Language: Good  Akathisia:  NA  Handed:  Right  AIMS (if indicated): not done  Assets:  Communication Skills Desire for Improvement Housing Social Support  ADL's:  Intact  Cognition: WNL  Sleep:  Fair   Screenings: GAD-7    Flowsheet Row Video Visit from 03/27/2021 in [facility] Video Visit from 01/10/2021 in [facility] Office Visit from 11/20/2020 in [facility]  Total GAD-7 Score 19 11 21       PHQ2-9    Flowsheet Row Video Visit from 03/27/2021 in [facility] Video Visit from 01/10/2021 in [facility] Counselor from 11/25/2020 in [facility] Office Visit from 11/20/2020 in [facility]  PHQ-2 Total Score 4 3 6 4   PHQ-9 Total Score 18 14 26 24       Flowsheet Row Video Visit from 03/27/2021 in [facility] Video Visit from 01/10/2021 in [facility] Counselor from 11/25/2020 in [facility]  C-SSRS RISK CATEGORY Low Risk Low Risk High Risk        Assessment and Plan:   Megan Lloyd is a 45 year old female with a past psychiatric history  significant for bipolar disorder, sleep disturbances, and generalized anxiety disorder who presents to [facility] via virtual telephone visit for follow-up and medication management.  She endorses worsening depression, sleep disturbances, and anxiety due to family related crisis.  Patient would like to adjust her medications to help her overcome this emotional obstacles she is currently contending with.  Patient was recommended adjusting her dosage of Seroquel from 50 mg to 100 mg at bedtime for the management of her mood, anxiety, and sleep disturbances.  Patient was also recommended adding on Seroquel 50 mg daily for the management of her anxiety.  Patient to continue taking all other medications as prescribed.  Patient's medications to be e-prescribed to pharmacy of choice.  1. Bipolar affective disorder, current episode depressed, current episode severity unspecified (HCC)  - QUEtiapine (SEROQUEL) 100 MG tablet; Take 1 tablet (100 mg total) by mouth at bedtime.  Dispense: 30 tablet; Refill: 1 - buPROPion (WELLBUTRIN XL) 150 MG 24 hr tablet; Take 1 tablet (150 mg total) by mouth every morning.  Dispense: 30 tablet; Refill: 1  2. Sleep disturbances  - QUEtiapine (SEROQUEL) 100 MG tablet; Take 1 tablet (100 mg total) by mouth at bedtime.  Dispense: 30 tablet; Refill: 1 - prazosin (MINIPRESS) 1 MG capsule; Take 1 capsule (1 mg total) by mouth at bedtime.  Dispense: 30 capsule; Refill: 1  3. Generalized anxiety disorder  - QUEtiapine (SEROQUEL) 50 MG tablet; Take 1 tablet (50 mg total) by mouth daily.  Dispense: 30 tablet; Refill: 1 - hydrOXYzine (ATARAX) 25 MG tablet; Take 1 tablet (25 mg total) by mouth 3 (three) times daily as needed for anxiety.  Dispense: 75 tablet; Refill: 1  Patient to follow up in 2 months Provider spent a total of 17 minutes with the patient/reviewing patient's chart  Meta Hatchet, PA 03/27/2021, 3:04 PM

## 2021-04-09 ENCOUNTER — Telehealth (HOSPITAL_COMMUNITY): Payer: Self-pay | Admitting: Licensed Clinical Social Worker

## 2021-04-09 ENCOUNTER — Ambulatory Visit (HOSPITAL_COMMUNITY): Payer: No Payment, Other | Admitting: Licensed Clinical Social Worker

## 2021-04-09 ENCOUNTER — Encounter (HOSPITAL_COMMUNITY): Payer: Self-pay

## 2021-04-09 NOTE — Telephone Encounter (Signed)
LCSW sent text message with link for video session per schedule. LCSW remained available online for session until 8:12am. Pt failed to sign on for session.

## 2021-05-08 ENCOUNTER — Other Ambulatory Visit: Payer: Self-pay

## 2021-05-15 ENCOUNTER — Ambulatory Visit (HOSPITAL_COMMUNITY): Payer: No Payment, Other | Admitting: Licensed Clinical Social Worker

## 2021-05-15 ENCOUNTER — Other Ambulatory Visit: Payer: Self-pay

## 2021-05-19 ENCOUNTER — Other Ambulatory Visit: Payer: Self-pay

## 2021-05-27 ENCOUNTER — Telehealth (INDEPENDENT_AMBULATORY_CARE_PROVIDER_SITE_OTHER): Payer: No Payment, Other | Admitting: Physician Assistant

## 2021-05-27 ENCOUNTER — Other Ambulatory Visit: Payer: Self-pay

## 2021-05-27 ENCOUNTER — Encounter (HOSPITAL_COMMUNITY): Payer: Self-pay | Admitting: Physician Assistant

## 2021-05-27 DIAGNOSIS — F3130 Bipolar disorder, current episode depressed, mild or moderate severity, unspecified: Secondary | ICD-10-CM

## 2021-05-27 DIAGNOSIS — G479 Sleep disorder, unspecified: Secondary | ICD-10-CM | POA: Diagnosis not present

## 2021-05-27 DIAGNOSIS — F411 Generalized anxiety disorder: Secondary | ICD-10-CM | POA: Diagnosis not present

## 2021-05-27 MED ORDER — BUPROPION HCL ER (XL) 150 MG PO TB24
150.0000 mg | ORAL_TABLET | ORAL | 2 refills | Status: DC
  Filled 2021-05-27 – 2021-06-10 (×2): qty 30, 30d supply, fill #0
  Filled 2021-07-09 – 2021-07-16 (×2): qty 30, 30d supply, fill #1

## 2021-05-27 MED ORDER — QUETIAPINE FUMARATE 100 MG PO TABS
100.0000 mg | ORAL_TABLET | Freq: Every day | ORAL | 2 refills | Status: DC
Start: 2021-05-27 — End: 2021-07-22
  Filled 2021-05-27 – 2021-07-16 (×3): qty 30, 30d supply, fill #0

## 2021-05-27 MED ORDER — PRAZOSIN HCL 1 MG PO CAPS
1.0000 mg | ORAL_CAPSULE | Freq: Every day | ORAL | 2 refills | Status: DC
  Filled 2021-05-27: qty 30, 30d supply, fill #0

## 2021-05-27 MED ORDER — QUETIAPINE FUMARATE 50 MG PO TABS
50.0000 mg | ORAL_TABLET | Freq: Every day | ORAL | 2 refills | Status: DC
  Filled 2021-05-27: qty 30, 30d supply, fill #0

## 2021-05-27 NOTE — Progress Notes (Addendum)
[place] MD/PA/NP OP Progress Note  Virtual Visit via Telephone Note  I connected with Megan Lloyd on 05/27/21 at  8:30 AM EST by telephone and verified that I am speaking with the correct person using two identifiers.  Location: Patient: Home Provider: Clinic   I discussed the limitations, risks, security and privacy concerns of performing an evaluation and management service by telephone and the availability of in person appointments. I also discussed with the patient that there may be a patient responsible charge related to this service. The patient expressed understanding and agreed to proceed.  Follow Up Instructions:   I discussed the assessment and treatment plan with the patient. The patient was provided an opportunity to ask questions and all were answered. The patient agreed with the plan and demonstrated an understanding of the instructions.   The patient was advised to call back or seek an in-person evaluation if the symptoms worsen or if the condition fails to improve as anticipated.  I provided 9 minutes of non-face-to-face time during this encounter.  Malachy Mood, PA    05/27/2021 1:00 PM Megan Lloyd  MRN:  [id number]  Chief Complaint:  Chief Complaint  Patient presents with   Medication Refill   Follow-up   HPI:   Megan Lloyd is a 46 year old female with a past psychiatric history significant for sleep disturbances, bipolar disorder, and generalized anxiety disorder who presents to [facility] via virtual telephone visit for follow-up and medication management.  Patient is currently being managed on the following medications:  Seroquel 100 mg at bedtime Bupropion (Wellbutrin XL) 150 mg 24-hour tablet daily Prazosin 1 mg at bedtime Seroquel 50 mg daily  Patient reports no issues or concerns regarding her current medication regimen.  Patient states that she has not been experiencing too much depression and states  that her mood is not as bad as before.  Patient reports that her son is doing better after his football injury.  She reports that he underwent surgery and is currently doing physical therapy.  Patient's son will be able to play collegiate football until October.  She reports that she will be able to see her son soon to see how he is doing.  Patient endorses anxiety and rates her anxiety at 3 or 4 out of 10.  Patient denied any other stressors at this time.  A PHQ-9 screen was performed with the patient scoring a 7.  A GAD-7 screen was also performed with the patient scoring an 11.  Patient is alert and oriented x4, calm, cooperative, and fully engaged in conversation during the encounter.  Patient states he feels calm but is sleepy due to working a late night shift the previous night.  Patient denies suicidal or homicidal ideations.  She further denies auditory or visual hallucinations and does not appear to be responding to internal/external stimuli.  Patient endorses good sleep and receives on average 8 hours of sleep each night.  Patient endorses increased appetite but still continues to eat 1 meal towards the end of the day.  Patient denies alcohol consumption and illicit drug use.  Patient endorses tobacco use and smokes on average a pack per day.  Visit Diagnosis:    ICD-10-CM   1. Sleep disturbances  G47.9 prazosin (MINIPRESS) 1 MG capsule    2. Bipolar affective disorder, current episode depressed, current episode severity unspecified ([place])  F31.30 QUEtiapine (SEROQUEL) 100 MG tablet    buPROPion (WELLBUTRIN XL) 150 MG 24 hr tablet  3. Generalized anxiety disorder  F41.1 QUEtiapine (SEROQUEL) 100 MG tablet    QUEtiapine (SEROQUEL) 50 MG tablet      Past Psychiatric History:  Bipolar disorder Sleep disturbances Generalized anxiety disorder  Past Medical History: History reviewed. No pertinent past medical history. History reviewed. No pertinent surgical history.  Family Psychiatric  History:  Patient is unsure of family psychiatric history. She states that she never knew her mom that well but states that she knows she had a lot of issues. She reports that her father was bat-shit crazy and used to kill their pets.  Family History: History reviewed. No pertinent family history.  Social History:  Social History   Socioeconomic History   Marital status: Single    Spouse name: Not on file   Number of children: Not on file   Years of education: Not on file   Highest education level: Not on file  Occupational History   Not on file  Tobacco Use   Smoking status: Every Day    Packs/day: 1.00    Types: Cigarettes   Smokeless tobacco: Never  Substance and Sexual Activity   Alcohol use: Not Currently   Drug use: Not Currently   Sexual activity: Not Currently  Other Topics Concern   Not on file  Social History Narrative   Not on file   Social Determinants of Health   Financial Resource Strain: Low Risk    Difficulty of Paying Living Expenses: Not hard at all  Food Insecurity: No Food Insecurity   Worried About Charity fundraiser in the Last Year: Never true   [place] in the Last Year: Never true  Transportation Needs: No Transportation Needs   Lack of Transportation (Medical): No   Lack of Transportation (Non-Medical): No  Physical Activity: Sufficiently Active   Days of Exercise per Week: 7 days   Minutes of Exercise per Session: 60 min  Stress: Stress Concern Present   Feeling of Stress : Very much  Social Connections: Socially Isolated   Frequency of Communication with Friends and Family: Three times a week   Frequency of Social Gatherings with Friends and Family: Twice a week   Attends Religious Services: Never   Marine scientist or Organizations: No   Attends Music therapist: Never   Marital Status: Never married    Allergies: Not on File  Metabolic Disorder Labs: No results found for: HGBA1C, MPG No results found  for: PROLACTIN No results found for: CHOL, TRIG, HDL, CHOLHDL, VLDL, LDLCALC No results found for: TSH  Therapeutic Level Labs: No results found for: LITHIUM No results found for: VALPROATE No components found for:  CBMZ  Current Medications: Current Outpatient Medications  Medication Sig Dispense Refill   buPROPion (WELLBUTRIN XL) 150 MG 24 hr tablet Take 1 tablet (150 mg total) by mouth every morning. 30 tablet 2   hydrOXYzine (ATARAX) 25 MG tablet Take 1 tablet (25 mg total) by mouth 3 (three) times daily as needed for anxiety. 75 tablet 1   prazosin (MINIPRESS) 1 MG capsule Take 1 capsule (1 mg total) by mouth at bedtime. 30 capsule 2   QUEtiapine (SEROQUEL) 100 MG tablet Take 1 tablet (100 mg total) by mouth at bedtime. 30 tablet 2   QUEtiapine (SEROQUEL) 50 MG tablet Take 1 tablet (50 mg total) by mouth daily. 30 tablet 2   No current facility-administered medications for this visit.     Musculoskeletal: Strength & Muscle Tone: Unable to assess  due to telemedicine visit [place]: Unable to assess due to telemedicine visit Patient leans: Unable to assess due to telemedicine visit  Psychiatric Specialty Exam: Review of Systems  Psychiatric/Behavioral:  Negative for decreased concentration, dysphoric mood, hallucinations, self-injury, sleep disturbance and suicidal ideas. The patient is nervous/anxious. The patient is not hyperactive.    There were no vitals taken for this visit.There is no height or weight on file to calculate BMI.  General Appearance: Unable to assess due to telemedicine visit  Eye Contact:  Unable to assess due to telemedicine visit  Speech:  Clear and Coherent and Normal Rate  Volume:  Normal  Mood:  Anxious and Euthymic  Affect:  Appropriate and Congruent  Thought Process:  Coherent and Descriptions of Associations: Intact  Orientation:  Full (Time, Place, and Person)  Thought Content: WDL   Suicidal Thoughts:  No  Homicidal Thoughts:  No   Memory:  Immediate;   Good Recent;   Good Remote;   Fair  Judgement:  Good  Insight:  Good  Psychomotor Activity:  Normal  Concentration:  Concentration: Good and Attention Span: Good  Recall:  Good  Fund of Knowledge: Good  Language: Good  Akathisia:  NA  Handed:  Right  AIMS (if indicated): not done  Assets:  Communication Skills Desire for Improvement Housing Social Support  ADL's:  Intact  Cognition: WNL  Sleep:  Good   Screenings: GAD-7    Flowsheet Row Video Visit from 05/27/2021 in [facility] 01/10/2021 in [facility] Office Visit from 11/20/2020 in [facility]  Total GAD-7 Score 11 19 11 21       PHQ2-9    Flowsheet Row Video Visit from 05/27/2021 in [facility] Video Visit from 03/27/2021 in [facility] Video Visit from 01/10/2021 in [facility] Counselor from 11/25/2020 in [facility] Office Visit from 11/20/2020 in [place]  PHQ-2 Total Score 2 4 3 6 4   PHQ-9 Total Score 7 18 14 26 24       Flowsheet Row Video Visit from 05/27/2021 in [facility] Video Visit from 03/27/2021 in [facility] Video Visit from 01/10/2021 in [place]        Assessment and Plan:   Megan Lloyd is a 46 year old female with a past psychiatric history significant for sleep disturbances, bipolar disorder, and generalized anxiety disorder who presents to [facility] via virtual telephone visit for follow-up and medication management.  Patient states that her mood has improved since her son's been  recovering from his football injury.  Patient still endorses some anxiety but appears to be more under control than the previous encounter.  Patient denies the need for dosage adjustments at this time and is requesting refills on all her medications following the conclusion of the encounter.  Patient's medications to be e-prescribed to pharmacy of choice.  1. Sleep disturbances  - prazosin (MINIPRESS) 1 MG capsule; Take 1 capsule (1 mg total) by mouth at bedtime.  Dispense: 30 capsule; Refill: 2  2. Bipolar affective disorder, current episode depressed, current episode severity unspecified ([facility])  - QUEtiapine (SEROQUEL) 100 MG tablet; Take 1 tablet (100 mg total) by mouth at  bedtime.  Dispense: 30 tablet; Refill: 2 - buPROPion (WELLBUTRIN XL) 150 MG 24 hr tablet; Take 1 tablet (150 mg total) by mouth every morning.  Dispense: 30 tablet; Refill: 2  3. Generalized anxiety disorder  - QUEtiapine (SEROQUEL) 100 MG tablet; Take 1 tablet (100 mg total) by mouth at bedtime.  Dispense: 30 tablet; Refill: 2 - QUEtiapine (SEROQUEL) 50 MG tablet; Take 1 tablet (50 mg total) by mouth daily.  Dispense: 30 tablet; Refill: 2  Patient to follow up in 2 months Provider spent a total of 9 minutes with the patient/reviewing patient's chart  Malachy Mood, PA 05/27/2021, 1:00 PM

## 2021-06-03 ENCOUNTER — Other Ambulatory Visit: Payer: Self-pay

## 2021-06-10 ENCOUNTER — Other Ambulatory Visit: Payer: Self-pay

## 2021-07-07 ENCOUNTER — Encounter (HOSPITAL_COMMUNITY): Payer: Self-pay

## 2021-07-07 ENCOUNTER — Emergency Department (HOSPITAL_COMMUNITY)
Admission: EM | Admit: 2021-07-07 | Discharge: 2021-07-07 | Disposition: A | Discharge status: Home / Self Care | Payer: Self-pay | Attending: Student | Admitting: Student

## 2021-07-07 ENCOUNTER — Emergency Department (HOSPITAL_COMMUNITY): Payer: Self-pay

## 2021-07-07 ENCOUNTER — Other Ambulatory Visit: Payer: Self-pay

## 2021-07-07 DIAGNOSIS — F17210 Nicotine dependence, cigarettes, uncomplicated: Secondary | ICD-10-CM | POA: Insufficient documentation

## 2021-07-07 DIAGNOSIS — R112 Nausea with vomiting, unspecified: Secondary | ICD-10-CM | POA: Insufficient documentation

## 2021-07-07 DIAGNOSIS — R350 Frequency of micturition: Secondary | ICD-10-CM | POA: Insufficient documentation

## 2021-07-07 DIAGNOSIS — R1032 Left lower quadrant pain: Secondary | ICD-10-CM | POA: Insufficient documentation

## 2021-07-07 HISTORY — DX: Disorder of kidney and ureter, unspecified: N28.9

## 2021-07-07 LAB — COMPREHENSIVE METABOLIC PANEL
ALT: 19 U/L (ref 0–44)
AST: 22 U/L (ref 15–41)
Albumin: 4.1 g/dL (ref 3.5–5.0)
Alkaline Phosphatase: 51 U/L (ref 38–126)
Anion gap: 9 (ref 5–15)
BUN: 13 mg/dL (ref 6–20)
CO2: 25 mmol/L (ref 22–32)
Calcium: 9.3 mg/dL (ref 8.9–10.3)
Chloride: 103 mmol/L (ref 98–111)
Creatinine, Ser: 0.67 mg/dL (ref 0.44–1.00)
GFR, Estimated: >60 mL/min (ref >60)
Glucose, Bld: 100 mg/dL — ABNORMAL HIGH (ref 70–99)
Potassium: 3.8 mmol/L (ref 3.5–5.1)
Sodium: 137 mmol/L (ref 135–145)
Total Bilirubin: 0.5 mg/dL (ref 0.3–1.2)
Total Protein: 7.6 g/dL (ref 6.5–8.1)

## 2021-07-07 LAB — CBC WITH DIFFERENTIAL/PLATELET
Abs Immature Granulocytes: 0.03 10*3/uL (ref 0.00–0.07)
Basophils Absolute: 0.1 10*3/uL (ref 0.0–0.1)
Basophils Relative: 1 %
Eosinophils Absolute: 0.2 10*3/uL (ref 0.0–0.5)
Eosinophils Relative: 2 %
HCT: 45.8 % (ref 36.0–46.0)
Hemoglobin: 15.1 g/dL — ABNORMAL HIGH (ref 12.0–15.0)
Immature Granulocytes: 0 %
Lymphocytes Relative: 32 %
Lymphs Abs: 2.7 10*3/uL (ref 0.7–4.0)
MCH: 33.6 pg (ref 26.0–34.0)
MCHC: 33 g/dL (ref 30.0–36.0)
MCV: 102 fL — ABNORMAL HIGH (ref 80.0–100.0)
Monocytes Absolute: 0.5 10*3/uL (ref 0.1–1.0)
Monocytes Relative: 6 %
Neutro Abs: 4.9 10*3/uL (ref 1.7–7.7)
Neutrophils Relative %: 59 %
Platelets: UNDETERMINED 10*3/uL (ref 150–400)
RBC: 4.49 MIL/uL (ref 3.87–5.11)
RDW: 11.9 % (ref 11.5–15.5)
WBC: 8.3 10*3/uL (ref 4.0–10.5)
nRBC: 0 % (ref 0.0–0.2)

## 2021-07-07 LAB — URINALYSIS, ROUTINE W REFLEX MICROSCOPIC
Bilirubin Urine: NEGATIVE
Glucose, UA: NEGATIVE mg/dL
Hgb urine dipstick: NEGATIVE
Ketones, ur: NEGATIVE mg/dL
Nitrite: NEGATIVE
Protein, ur: NEGATIVE mg/dL
Specific Gravity, Urine: 1.005 (ref 1.005–1.030)
pH: 5 (ref 5.0–8.0)

## 2021-07-07 LAB — I-STAT BETA HCG BLOOD, ED (MC, WL, AP ONLY): I-stat hCG, quantitative: <5 m[IU]/mL (ref <5)

## 2021-07-07 LAB — LIPASE, BLOOD: Lipase: 30 U/L (ref 11–51)

## 2021-07-07 MED ORDER — ONDANSETRON HCL 4 MG/2ML IJ SOLN
4.0000 mg | Freq: Once | INTRAMUSCULAR | Status: AC
  Administered 2021-07-07: 4 mg via INTRAVENOUS
  Filled 2021-07-07: qty 2

## 2021-07-07 MED ORDER — LIDOCAINE 4 % EX PTCH
1.0000 | MEDICATED_PATCH | Freq: Two times a day (BID) | CUTANEOUS | 0 refills | Status: DC
  Filled 2021-07-07: qty 15, 15d supply, fill #0

## 2021-07-07 MED ORDER — MORPHINE SULFATE (PF) 4 MG/ML IV SOLN
4.0000 mg | Freq: Once | INTRAVENOUS | Status: AC
  Administered 2021-07-07: 4 mg via INTRAVENOUS
  Filled 2021-07-07: qty 1

## 2021-07-07 MED ORDER — NAPROXEN 375 MG PO TABS
375.0000 mg | ORAL_TABLET | Freq: Two times a day (BID) | ORAL | 0 refills | Status: DC
  Filled 2021-07-07 – 2021-07-16 (×2): qty 20, 10d supply, fill #0

## 2021-07-07 MED ORDER — LIDOCAINE 5 % EX PTCH
1.0000 | MEDICATED_PATCH | CUTANEOUS | Status: DC
  Administered 2021-07-07: 1 via TRANSDERMAL
  Filled 2021-07-07: qty 1

## 2021-07-07 MED ORDER — KETOROLAC TROMETHAMINE 15 MG/ML IJ SOLN
15.0000 mg | Freq: Once | INTRAMUSCULAR | Status: AC
  Administered 2021-07-07: 15 mg via INTRAVENOUS
  Filled 2021-07-07: qty 1

## 2021-07-07 MED ORDER — IOHEXOL 300 MG/ML  SOLN
100.0000 mL | Freq: Once | INTRAMUSCULAR | Status: AC | PRN
  Administered 2021-07-07: 100 mL via INTRAVENOUS

## 2021-07-07 MED ORDER — LACTATED RINGERS IV BOLUS
1000.0000 mL | Freq: Once | INTRAVENOUS | Status: AC
  Administered 2021-07-07: 1000 mL via INTRAVENOUS

## 2021-07-07 NOTE — ED Notes (Signed)
Discharge instructions reviewed, questions answered. Rx education provided. Pt states understanding and no further questions. Pt ambulatory with steady gait upon discharge. No s/s of distress noted. ? ?

## 2021-07-07 NOTE — ED Notes (Signed)
Patient transported to CT 

## 2021-07-07 NOTE — ED Triage Notes (Signed)
Patient c/o LUQ pain that radiates into the back left groin area x 1 1/2 weeks. Patient also reports urinating frequently. ?Patient reports a history of kidney stones. ?

## 2021-07-07 NOTE — ED Provider Notes (Signed)
?[facility]-EMERGENCY DEPT ?Provider Note ? ?CSN: [id number] ?Arrival date & time: 07/07/21 1440 ? ?Chief Complaint(s) ?Urinary Frequency and Abdominal Pain ? ?HPI ?Megan Lloyd is a 46 y.o. female with PMH nephrolithiasis, bipolar affective disorder, generalized anxiety disorder who presents emergency department for evaluation of abdominal pain nausea vomiting.  Patient states that she has had pain in the left lower quadrant and in the left flank progressively worsening over the last 1-1/2 weeks.  Worsened significantly over the last 24 hours.  Endorses mild urinary frequency but denies chest pain, shortness of breath, headache, fever or other systemic symptoms. ? ? ?Urinary Frequency ?Associated symptoms include abdominal pain.  ?Abdominal Pain ? ?Past Medical History ?Past Medical History:  ?Diagnosis Date  ? Renal disorder   ? ?Patient Active Problem List  ? Diagnosis Date Noted  ? Bipolar affective disorder, current episode depressed (HCC) 11/20/2020  ? Generalized anxiety disorder 11/20/2020  ? Sleep disturbances 11/20/2020  ? ?Home Medication(s) ?Prior to Admission medications   ?Medication Sig Start Date End Date Taking? Authorizing Provider  ?buPROPion (WELLBUTRIN XL) 150 MG 24 hr tablet Take 1 tablet (150 mg total) by mouth every morning. 05/27/21 05/27/22  Meta Hatchet, PA  ?hydrOXYzine (ATARAX) 25 MG tablet Take 1 tablet (25 mg total) by mouth 3 (three) times daily as needed for anxiety. 03/27/21   Nwoko, Tommas Olp, PA  ?prazosin (MINIPRESS) 1 MG capsule Take 1 capsule (1 mg total) by mouth at bedtime. 05/27/21   Meta Hatchet, PA  ?QUEtiapine (SEROQUEL) 100 MG tablet Take 1 tablet (100 mg total) by mouth at bedtime. 05/27/21   Meta Hatchet, PA  ?QUEtiapine (SEROQUEL) 50 MG tablet Take 1 tablet (50 mg total) by mouth daily. 05/27/21 05/27/22  Meta Hatchet, PA  ?                                                                                                                                   ?Past Surgical History ?Past Surgical History:  ?Procedure Laterality Date  ? APPENDECTOMY    ? CHOLECYSTECTOMY    ? ?Family History ?History reviewed. No pertinent family history. ? ?Social History ?Social History  ? ?Tobacco Use  ? Smoking status: Every Day  ?  Packs/day: 1.00  ?  Types: Cigarettes  ? Smokeless tobacco: Never  ?Vaping Use  ? Vaping Use: Some days  ? Substances: Nicotine, Flavoring  ?Substance Use Topics  ? Alcohol use: Not Currently  ? Drug use: Never  ? ?Allergies ?Patient has no known allergies. ? ?Review of Systems ?Review of Systems  ?Gastrointestinal:  Positive for abdominal pain.  ?Genitourinary:  Positive for frequency.  ? ?Physical Exam ?Vital Signs  ?I have reviewed the triage vital signs ?BP (!) 183/115   Pulse 79   Temp 98.5 ?F (36.9 ?C) (Oral)   Resp 18   Ht 5\' 8"  (1.727 m)   Wt 90.7 kg  LMP 06/30/2021   SpO2 98%   BMI 30.41 kg/m?  ? ?Physical Exam ?Vitals and nursing note reviewed.  ?Constitutional:   ?   General: She is not in acute distress. ?   Appearance: She is well-developed.  ?HENT:  ?   Head: Normocephalic and atraumatic.  ?Eyes:  ?   Conjunctiva/sclera: Conjunctivae normal.  ?Cardiovascular:  ?   Rate and Rhythm: Normal rate and regular rhythm.  ?   Heart sounds: No murmur heard. ?Pulmonary:  ?   Effort: Pulmonary effort is normal. No respiratory distress.  ?   Breath sounds: Normal breath sounds.  ?Abdominal:  ?   Palpations: Abdomen is soft.  ?   Tenderness: There is abdominal tenderness in the left lower quadrant. There is left CVA tenderness.  ?Musculoskeletal:     ?   General: No swelling.  ?   Cervical back: Neck supple.  ?Skin: ?   General: Skin is warm and dry.  ?   Capillary Refill: Capillary refill takes less than 2 seconds.  ?Neurological:  ?   Mental Status: She is alert.  ?Psychiatric:     ?   Mood and Affect: Mood normal.  ? ? ?ED Results and Treatments ?Labs ?(all labs ordered are listed, but only abnormal results are displayed) ?Labs  Reviewed  ?URINALYSIS, ROUTINE W REFLEX MICROSCOPIC - Abnormal; Notable for the following components:  ?    Result Value  ? Color, Urine STRAW (*)   ? Leukocytes,Ua SMALL (*)   ? Bacteria, UA RARE (*)   ? All other components within normal limits  ?CBC WITH DIFFERENTIAL/PLATELET - Abnormal; Notable for the following components:  ? Hemoglobin 15.1 (*)   ? MCV 102.0 (*)   ? All other components within normal limits  ?COMPREHENSIVE METABOLIC PANEL - Abnormal; Notable for the following components:  ? Glucose, Bld 100 (*)   ? All other components within normal limits  ?LIPASE, BLOOD  ?I-STAT BETA HCG BLOOD, ED ([facility], [facility], [facility] ONLY)  ?                                                                                                                       ? ?Radiology ?No results found. ? ?Pertinent labs & imaging results that were available during my care of the patient were reviewed by me and considered in my medical decision making (see MDM for details). ? ?Medications Ordered in ED ?Medications - No data to display                                                               ?                                                                    ?  Procedures ?Procedures ? ?(including critical care time) ? ?Medical Decision Making / ED Course ? ? ?This patient presents to the ED for concern of left lower quadrant abdominal pain, this involves an extensive number of treatment options, and is a complaint that carries with it a high risk of complications and morbidity.  The differential diagnosis includes diverticulitis, nephrolithiasis, pyelonephritis, musculoskeletal pain ? ?MDM: ?Patient seen in the emergency department for evaluation of abdominal pain.  Physical exam reveals mild tenderness of the left lower quadrant and over the left flank.  Physical exam otherwise unremarkable.  Laboratory evaluation largely unremarkable.  Urinalysis negative.  Patient given morphine and Zofran for pain control and CT abdomen pelvis was  obtained that was reassuringly negative for diverticulitis or other acute infection in the abdomen.  On reevaluation, patient with mild persistent pain and thus was given a lidocaine patch and Toradol and on second reevaluation her pain had significantly improved.  With negative imaging and laboratory work-up, patient safe for discharge with outpatient follow-up.  Patient then discharged with a prescription for Naprosyn and lidocaine patch. ? ? ?Additional history obtained: ? ?-External records from outside source obtained and reviewed including: Chart review including previous notes, labs, imaging, consultation notes ? ? ?Lab Tests: ?-I ordered, reviewed, and interpreted labs.   ?The pertinent results include:   ?Labs Reviewed  ?URINALYSIS, ROUTINE W REFLEX MICROSCOPIC - Abnormal; Notable for the following components:  ?    Result Value  ? Color, Urine STRAW (*)   ? Leukocytes,Ua SMALL (*)   ? Bacteria, UA RARE (*)   ? All other components within normal limits  ?CBC WITH DIFFERENTIAL/PLATELET - Abnormal; Notable for the following components:  ? Hemoglobin 15.1 (*)   ? MCV 102.0 (*)   ? All other components within normal limits  ?COMPREHENSIVE METABOLIC PANEL - Abnormal; Notable for the following components:  ? Glucose, Bld 100 (*)   ? All other components within normal limits  ?LIPASE, BLOOD  ?I-STAT BETA HCG BLOOD, ED ([facility], [facility], [facility] ONLY)  ?  ? ? ?Imaging Studies ordered: ?I ordered imaging studies including CTAP ?I independently visualized and interpreted imaging. ?I agree with the radiologist interpretation ? ? ?Medicines ordered and prescription drug management: ?No orders of the defined types were placed in this encounter. ?  ?-I have reviewed the patients home medicines and have made adjustments as needed ? ?Critical interventions ?none ? ? ?Cardiac Monitoring: ?The patient was maintained on a cardiac monitor.  I personally viewed and interpreted the cardiac monitored which showed an underlying rhythm of:  NSr ? ?Social Determinants of Health:  ?Factors impacting patients care include: none ? ? ?Reevaluation: ?After the interventions noted above, I reevaluated the patient and found that they have :improved ? ?Co morbidities

## 2021-07-07 NOTE — ED Provider Triage Note (Signed)
Emergency Medicine Provider Triage Evaluation Note ? ?Megan Lloyd , a 46 y.o. female  was evaluated in triage.  Pt complains of left side abdominal pain that has been ongoing for 12 days intermittently. The patient states that the pain has been more severe over the past 3 days. She has intermittent diarrhea and nausea but no emesis. The pain does extend to the left CVA area. Patient has hx of kidney stones and states pain is similar. Patient has had appendectomy.  ? ?Review of Systems  ?Positive: LLQ abd pain, diarrhea, urinary frequency ?Negative: Dysuria, emesis, shortness of breath ? ?Physical Exam  ?BP (!) 170/95 (BP Location: Left Arm)   Pulse 91   Temp 98.5 ?F (36.9 ?C) (Oral)   Resp 18   Ht 5\' 8"  (1.727 m)   Wt 90.7 kg   LMP 06/30/2021   SpO2 95%   BMI 30.41 kg/m?  ?Gen:   Awake, no distress   ?Resp:  Normal effort  ?MSK:   Moves extremities without difficulty  ?Other:  LLQ tenderness ? ?Medical Decision Making  ?Medically screening exam initiated at 3:19 PM.  Appropriate orders placed.  Megan Lloyd was informed that the remainder of the evaluation will be completed by another provider, this initial triage assessment does not replace that evaluation, and the importance of remaining in the ED until their evaluation is complete. ? ? ?  ?[name], PA-C ?07/07/21 1521 ? ?

## 2021-07-08 ENCOUNTER — Other Ambulatory Visit: Payer: Self-pay

## 2021-07-09 ENCOUNTER — Other Ambulatory Visit: Payer: Self-pay

## 2021-07-15 ENCOUNTER — Other Ambulatory Visit: Payer: Self-pay

## 2021-07-16 ENCOUNTER — Other Ambulatory Visit: Payer: Self-pay

## 2021-07-17 ENCOUNTER — Other Ambulatory Visit: Payer: Self-pay

## 2021-07-22 ENCOUNTER — Telehealth (INDEPENDENT_AMBULATORY_CARE_PROVIDER_SITE_OTHER): Payer: No Payment, Other | Admitting: Physician Assistant

## 2021-07-22 ENCOUNTER — Encounter (HOSPITAL_COMMUNITY): Payer: Self-pay | Admitting: Physician Assistant

## 2021-07-22 DIAGNOSIS — F411 Generalized anxiety disorder: Secondary | ICD-10-CM | POA: Diagnosis not present

## 2021-07-22 DIAGNOSIS — F3130 Bipolar disorder, current episode depressed, mild or moderate severity, unspecified: Secondary | ICD-10-CM | POA: Diagnosis not present

## 2021-07-22 DIAGNOSIS — G479 Sleep disorder, unspecified: Secondary | ICD-10-CM | POA: Diagnosis not present

## 2021-07-22 MED ORDER — QUETIAPINE FUMARATE 100 MG PO TABS
100.0000 mg | ORAL_TABLET | Freq: Every day | ORAL | 1 refills | Status: DC
  Filled 2021-07-22 – 2021-08-25 (×2): qty 30, 30d supply, fill #0
  Filled 2021-09-23: qty 30, 30d supply, fill #1

## 2021-07-22 MED ORDER — QUETIAPINE FUMARATE 25 MG PO TABS
25.0000 mg | ORAL_TABLET | Freq: Every day | ORAL | 1 refills | Status: DC
  Filled 2021-07-22 – 2021-08-25 (×2): qty 30, 30d supply, fill #0
  Filled 2021-09-23: qty 30, 30d supply, fill #1

## 2021-07-22 MED ORDER — PRAZOSIN HCL 2 MG PO CAPS
2.0000 mg | ORAL_CAPSULE | Freq: Every day | ORAL | 1 refills | Status: DC
  Filled 2021-07-22: qty 30, 30d supply, fill #0

## 2021-07-22 MED ORDER — BUPROPION HCL ER (XL) 150 MG PO TB24
150.0000 mg | ORAL_TABLET | ORAL | 1 refills | Status: DC
Start: 2021-07-22 — End: 2021-09-23
  Filled 2021-07-22 – 2021-08-25 (×2): qty 30, 30d supply, fill #0
  Filled 2021-09-23: qty 30, 30d supply, fill #1

## 2021-07-22 NOTE — Progress Notes (Signed)
BH MD/PA/NP OP Progress Note ? ?Virtual Visit via Telephone Note ? ?I connected with Megan Lloyd on 07/22/21 at  2:30 PM EDT by telephone and verified that I am speaking with the correct person using two identifiers. ? ?Location: ?Patient: Home ?Provider: Clinic ?  ?I discussed the limitations, risks, security and privacy concerns of performing an evaluation and management service by telephone and the availability of in person appointments. I also discussed with the patient that there may be a patient responsible charge related to this service. The patient expressed understanding and agreed to proceed. ? ?Follow Up Instructions: ? ?I discussed the assessment and treatment plan with the patient. The patient was provided an opportunity to ask questions and all were answered. The patient agreed with the plan and demonstrated an understanding of the instructions. ?  ?The patient was advised to call back or seek an in-person evaluation if the symptoms worsen or if the condition fails to improve as anticipated. ? ?I provided 17 minutes of non-face-to-face time during this encounter. ? ?Malachy Mood, PA ? ? ?07/22/2021 10:53 PM ?Megan Lloyd  ?MRN:  [id number] ? ?Chief Complaint:  ?Chief Complaint  ?Patient presents with  ? Follow-up  ? Medication Management  ? ?HPI:  ? ?Megan Lloyd is a 46 year old female with a past psychiatric history significant for anxiety disorder, bipolar disorder, and sleep disturbances who presents to [facility] via virtual telephone visit for follow-up and medication management.  Patient is currently being managed on the following medications: ? ?Prazosin 1 mg at bedtime ?Seroquel 100 mg at bedtime ?Bupropion (Wellbutrin XL) 150 mg 24-hour tablet daily ?Seroquel 50 mg daily ? ?Patient reports that her father recently passed away.  She expresses that she does not know how to feel regarding the news due to her past relationship with her father  being rocky.  Patient reports that she did not attend his funeral and states that her sister met with her for a few days for support.  In regards to her depression, patient states that although she experiences depressive episodes nearly every day, she reports that her mood is getting better.  Patient's depressive episodes are characterized by the following: disorientation, losing her bearings, being lost in thoughts, difficulty concentrating, and being unable to stay on task. ? ?Patient endorses anxiety and rates her anxiety an 8 out of 10.  Patient's main stressor revolves around the recent passing of her father.  She expresses that she is unsure how to feel but states that she does feel different and conflicted regarding the news.  Patient denies neglecting her activities of daily living and states that she is able to get out of the house to run errands.  She continues to endorse issues with sleep.  A PHQ-9 screen was performed with the patient scoring a 16.  A GAD-7 screen was also performed with the patient scoring a 20. ? ?Patient is alert and oriented x4, calm, cooperative, and fully engaged in conversation during the encounter.  Patient reports feeling mentally exhausted.  Patient denies suicidal or homicidal ideations.  She further denies auditory or visual hallucinations and does not appear to be responding to internal/external stimuli.  Patient endorses poor sleep and receives on average 4 hours of sleep.  Patient reports that she has crazy and vivid dreams often.  Patient endorses binge eating on occasion.  On average, patient eats 2 small meals per day.  Patient endorses alcohol consumption occasionally.  Patient endorses tobacco use and  smokes on average a pack per day.  Patient denies illicit drug use. ? ?Visit Diagnosis:  ?  ICD-10-CM   ?1. Generalized anxiety disorder  F41.1 QUEtiapine (SEROQUEL) 25 MG tablet  ?  QUEtiapine (SEROQUEL) 100 MG tablet  ?  ?2. Bipolar affective disorder, current episode  depressed, current episode severity unspecified ([place])  F31.30 buPROPion (WELLBUTRIN XL) 150 MG 24 hr tablet  ?  QUEtiapine (SEROQUEL) 100 MG tablet  ?  ?3. Sleep disturbances  G47.9 prazosin (MINIPRESS) 2 MG capsule  ?  ? ? ?Past Psychiatric History:  ?Bipolar disorder ?Sleep disturbances ?Generalized anxiety disorder ? ?Past Medical History:  ?Past Medical History:  ?Diagnosis Date  ? Renal disorder   ?  ?Past Surgical History:  ?Procedure Laterality Date  ? APPENDECTOMY    ? CHOLECYSTECTOMY    ? ? ?Family Psychiatric History:  ?Patient is unsure of family psychiatric history. She states that she never knew her mom that well but states that she knows she had a lot of issues. She reports that her father was bat-shit crazy and used to kill their pets. ? ?Family History: History reviewed. No pertinent family history. ? ?Social History:  ?Social History  ? ?Socioeconomic History  ? Marital status: Single  ?  Spouse name: Not on file  ? Number of children: Not on file  ? Years of education: Not on file  ? Highest education level: Not on file  ?Occupational History  ? Not on file  ?Tobacco Use  ? Smoking status: Every Day  ?  Packs/day: 1.00  ?  Types: Cigarettes  ? Smokeless tobacco: Never  ?Vaping Use  ? Vaping Use: Some days  ? Substances: Nicotine, Flavoring  ?Substance and Sexual Activity  ? Alcohol use: Not Currently  ? Drug use: Never  ? Sexual activity: Not Currently  ?Other Topics Concern  ? Not on file  ?Social History Narrative  ? Not on file  ? ?Social Determinants of Health  ? ?Financial Resource Strain: Low Risk   ? Difficulty of Paying Living Expenses: Not hard at all  ?Food Insecurity: No Food Insecurity  ? Worried About Charity fundraiser in the Last Year: Never true  ? Ran Out of Food in the Last Year: Never true  ?Transportation Needs: No Transportation Needs  ? Lack of Transportation (Medical): No  ? Lack of Transportation (Non-Medical): No  ?Physical Activity: Sufficiently Active  ? Days of Exercise  per Week: 7 days  ? Minutes of Exercise per Session: 60 min  ?Stress: Stress Concern Present  ? Feeling of Stress : Very much  ?Social Connections: Socially Isolated  ? Frequency of Communication with Friends and Family: Three times a week  ? Frequency of Social Gatherings with Friends and Family: Twice a week  ? Attends Religious Services: Never  ? Active Member of Clubs or Organizations: No  ? Attends Archivist Meetings: Never  ? Marital Status: Never married  ? ? ?Allergies: No Known Allergies ? ?Metabolic Disorder Labs: ?No results found for: HGBA1C, MPG ?No results found for: PROLACTIN ?No results found for: CHOL, TRIG, HDL, CHOLHDL, VLDL, LDLCALC ?No results found for: TSH ? ?Therapeutic Level Labs: ?No results found for: LITHIUM ?No results found for: VALPROATE ?No components found for:  CBMZ ? ?Current Medications: ?Current Outpatient Medications  ?Medication Sig Dispense Refill  ? buPROPion (WELLBUTRIN XL) 150 MG 24 hr tablet Take 1 tablet (150 mg total) by mouth every morning. 30 tablet 1  ? hydrOXYzine (ATARAX)  25 MG tablet Take 1 tablet (25 mg total) by mouth 3 (three) times daily as needed for anxiety. 75 tablet 1  ? Lidocaine (SALONPAS PAIN RELIEVING) 4 % PTCH Apply 1 each topically every 12 (twelve) hours. 15 patch 0  ? naproxen (NAPROSYN) 375 MG tablet Take 1 tablet (375 mg total) by mouth 2 (two) times daily. 20 tablet 0  ? prazosin (MINIPRESS) 2 MG capsule Take 1 capsule (2 mg total) by mouth at bedtime. 30 capsule 1  ? QUEtiapine (SEROQUEL) 100 MG tablet Take 1 tablet (100 mg total) by mouth at bedtime. 30 tablet 1  ? QUEtiapine (SEROQUEL) 25 MG tablet Take 1 tablet (25 mg total) by mouth daily. 30 tablet 1  ? ?No current facility-administered medications for this visit.  ? ? ? ?Musculoskeletal: ?Strength & Muscle Tone: Unable to assess due to telemedicine visit ?Gait & Station: Unable to assess due to telemedicine visit ?Patient leans: Unable to assess due to telemedicine  visit ? ?Psychiatric Specialty Exam: ?Review of Systems  ?Psychiatric/Behavioral:  Positive for sleep disturbance. Negative for decreased concentration, dysphoric mood, hallucinations, self-injury and suicidal idea

## 2021-07-23 ENCOUNTER — Other Ambulatory Visit: Payer: Self-pay

## 2021-07-30 ENCOUNTER — Other Ambulatory Visit: Payer: Self-pay

## 2021-08-25 ENCOUNTER — Other Ambulatory Visit: Payer: Self-pay

## 2021-08-26 ENCOUNTER — Other Ambulatory Visit: Payer: Self-pay

## 2021-08-27 ENCOUNTER — Other Ambulatory Visit: Payer: Self-pay

## 2021-08-29 ENCOUNTER — Other Ambulatory Visit: Payer: Self-pay

## 2021-09-09 ENCOUNTER — Other Ambulatory Visit: Payer: Self-pay

## 2021-09-09 MED ORDER — LORAZEPAM 2 MG PO TABS
ORAL_TABLET | ORAL | 0 refills | Status: DC
  Filled 2021-09-09: qty 2, 1d supply, fill #0

## 2021-09-09 MED ORDER — AMOXICILLIN 875 MG PO TABS
ORAL_TABLET | ORAL | 0 refills | Status: DC
Start: 2021-09-09 — End: 2023-04-28
  Filled 2021-09-09: qty 10, 5d supply, fill #0

## 2021-09-09 MED ORDER — IBUPROFEN 600 MG PO TABS
600.0000 mg | ORAL_TABLET | Freq: Four times a day (QID) | ORAL | 0 refills | Status: DC | PRN
  Filled 2021-09-09: qty 20, 5d supply, fill #0

## 2021-09-10 ENCOUNTER — Other Ambulatory Visit: Payer: Self-pay

## 2021-09-10 MED ORDER — HYDROCODONE-ACETAMINOPHEN 7.5-325 MG PO TABS
1.0000 | ORAL_TABLET | Freq: Four times a day (QID) | ORAL | 0 refills | Status: DC | PRN
  Filled 2021-09-10: qty 5, 2d supply, fill #0

## 2021-09-23 ENCOUNTER — Telehealth (INDEPENDENT_AMBULATORY_CARE_PROVIDER_SITE_OTHER): Payer: No Payment, Other | Admitting: Physician Assistant

## 2021-09-23 ENCOUNTER — Other Ambulatory Visit: Payer: Self-pay

## 2021-09-23 DIAGNOSIS — F3130 Bipolar disorder, current episode depressed, mild or moderate severity, unspecified: Secondary | ICD-10-CM

## 2021-09-23 DIAGNOSIS — F411 Generalized anxiety disorder: Secondary | ICD-10-CM | POA: Diagnosis not present

## 2021-09-23 DIAGNOSIS — G479 Sleep disorder, unspecified: Secondary | ICD-10-CM

## 2021-09-23 MED ORDER — QUETIAPINE FUMARATE 100 MG PO TABS
100.0000 mg | ORAL_TABLET | Freq: Every day | ORAL | 3 refills | Status: DC
  Filled 2021-09-23 – 2021-11-12 (×2): qty 30, 30d supply, fill #0
  Filled 2021-12-10 – 2021-12-17 (×2): qty 30, 30d supply, fill #1
  Filled 2022-01-12: qty 30, 30d supply, fill #2
  Filled 2022-02-12: qty 30, 30d supply, fill #3

## 2021-09-23 MED ORDER — BUPROPION HCL ER (XL) 150 MG PO TB24
150.0000 mg | ORAL_TABLET | ORAL | 3 refills | Status: DC
  Filled 2021-09-23 – 2021-11-12 (×2): qty 30, 30d supply, fill #0
  Filled 2021-12-10 – 2021-12-17 (×2): qty 30, 30d supply, fill #1
  Filled 2022-01-12: qty 30, 30d supply, fill #2
  Filled 2022-02-12: qty 30, 30d supply, fill #3

## 2021-09-23 MED ORDER — QUETIAPINE FUMARATE 25 MG PO TABS
25.0000 mg | ORAL_TABLET | Freq: Every day | ORAL | 3 refills | Status: DC
  Filled 2021-09-23: qty 30, 30d supply, fill #0

## 2021-09-23 MED ORDER — PRAZOSIN HCL 2 MG PO CAPS
2.0000 mg | ORAL_CAPSULE | Freq: Every day | ORAL | 3 refills | Status: DC
  Filled 2021-09-23: qty 30, 30d supply, fill #0

## 2021-09-23 NOTE — Progress Notes (Signed)
BH MD/PA/NP OP Progress Note  Virtual Visit via Telephone Note  I connected with Megan Lloyd on 09/26/21 at  2:30 PM EDT by telephone and verified that I am speaking with the correct person using two identifiers.  Location: Patient: Home Provider: Clinic   I discussed the limitations, risks, security and privacy concerns of performing an evaluation and management service by telephone and the availability of in person appointments. I also discussed with the patient that there may be a patient responsible charge related to this service. The patient expressed understanding and agreed to proceed.  Follow Up Instructions:  I discussed the assessment and treatment plan with the patient. The patient was provided an opportunity to ask questions and all were answered. The patient agreed with the plan and demonstrated an understanding of the instructions.   The patient was advised to call back or seek an in-person evaluation if the symptoms worsen or if the condition fails to improve as anticipated.  I provided 11 minutes of non-face-to-face time during this encounter.  Meta Hatchet, PA   09/26/2021 10:42 AM Megan Lloyd  MRN:  [id number]  Chief Complaint:  Chief Complaint  Patient presents with   Follow-up   Medication Refill   HPI:   Megan Lloyd is a 46 year old female with a past psychiatric history significant for anxiety disorder, bipolar disorder, and sleep disturbances who presents to [facility] via virtual telephone visit for follow-up and medication management.  Patient is currently being managed on the following medications:  Prazosin 1 mg at bedtime Seroquel 100 mg at bedtime Bupropion (Wellbutrin XL) 150 mg 24-hour tablet daily Seroquel 50 mg daily  Patient presents to the encounter not appearing to be in any acute distress.  She reports that she recently had all her teeth pulled and will be fitted for dentures in the  coming months.  Patient states that she feels more normal since having her teeth pulled.  In regards to her medications, patient admits to days where she did not take her medications and noticed a huge difference in her mood without them.  Patient is regularly taking her medications and states that her depressive episodes have not been too overbearing.  Patient states that she has been working a lot and keeping herself busy to avoid depressive episodes.  Patient endorses anxiety and rates her anxiety as 7 out of 10.  She reports being worried lately but is unable to pinpoint why.  A PHQ-9 screen was performed the patient scoring a 6.  A GAD-7 screen was also performed with the patient scoring a 9.  Patient is alert and oriented x4, calm, cooperative, and fully engaged in conversation during the encounter.  Patient endorses good mood and states that she feels happy.  Patient denies suicidal or homicidal ideations.  Patient denies auditory or visual hallucinations and does not appear to be responding to internal/external stimuli.  Patient endorses good sleep and receives on average 8 hours of sleep each night.  Patient endorses fair appetite and eats on average 1-2 meals per day.  Patient denies alcohol consumption and illicit drug use.  Patient endorses tobacco use and smokes on average half pack per day.  Visit Diagnosis:    ICD-10-CM   1. Generalized anxiety disorder  F41.1 QUEtiapine (SEROQUEL) 25 MG tablet    QUEtiapine (SEROQUEL) 100 MG tablet    2. Bipolar affective disorder, current episode depressed, current episode severity unspecified (HCC)  F31.30 QUEtiapine (SEROQUEL) 100 MG tablet  buPROPion (WELLBUTRIN XL) 150 MG 24 hr tablet    3. Sleep disturbances  G47.9 prazosin (MINIPRESS) 2 MG capsule      Past Psychiatric History:  Bipolar disorder Sleep disturbances Generalized anxiety disorder  Past Medical History:  Past Medical History:  Diagnosis Date   Renal disorder     Past  Surgical History:  Procedure Laterality Date   APPENDECTOMY     CHOLECYSTECTOMY      Family Psychiatric History:  Patient is unsure of family psychiatric history. She states that she never knew her mom that well but states that she knows she had a lot of issues. She reports that her father was bat-shit crazy and used to kill their pets.  Family History: History reviewed. No pertinent family history.  Social History:  Social History   Socioeconomic History   Marital status: Single    Spouse name: Not on file   Number of children: Not on file   Years of education: Not on file   Highest education level: Not on file  Occupational History   Not on file  Tobacco Use   Smoking status: Every Day    Packs/day: 1.00    Types: Cigarettes   Smokeless tobacco: Never  Vaping Use   Vaping Use: Some days   Substances: Nicotine, Flavoring  Substance and Sexual Activity   Alcohol use: Not Currently   Drug use: Never   Sexual activity: Not Currently  Other Topics Concern   Not on file  Social History Narrative   Not on file   Social Determinants of Health   Financial Resource Strain: Low Risk  (11/25/2020)   Overall Financial Resource Strain (CARDIA)    Difficulty of Paying Living Expenses: Not hard at all  Food Insecurity: No Food Insecurity (11/25/2020)   Hunger Vital Sign    Worried About Running Out of Food in the Last Year: Never true    Ran Out of Food in the Last Year: Never true  Transportation Needs: No Transportation Needs (11/25/2020)   PRAPARE - Administrator, Civil Service (Medical): No    Lack of Transportation (Non-Medical): No  Physical Activity: Sufficiently Active (11/25/2020)   Exercise Vital Sign    Days of Exercise per Week: 7 days    Minutes of Exercise per Session: 60 min  Stress: Stress Concern Present (11/25/2020)   [employer] of Occupational Health - Occupational Stress Questionnaire    Feeling of Stress : Very much  Social Connections:  Socially Isolated (11/25/2020)   Social Connection and Isolation Panel [NHANES]    Frequency of Communication with Friends and Family: Three times a week    Frequency of Social Gatherings with Friends and Family: Twice a week    Attends Religious Services: Never    Database administrator or Organizations: No    Attends Engineer, structural: Never    Marital Status: Never married    Allergies: No Known Allergies  Metabolic Disorder Labs: No results found for: "HGBA1C", "MPG" No results found for: "PROLACTIN" No results found for: "CHOL", "TRIG", "HDL", "CHOLHDL", "VLDL", "LDLCALC" No results found for: "TSH"  Therapeutic Level Labs: No results found for: "LITHIUM" No results found for: "VALPROATE" No results found for: "CBMZ"  Current Medications: Current Outpatient Medications  Medication Sig Dispense Refill   amoxicillin (AMOXIL) 875 MG tablet TAKE 1 TABLET BY MOUTH EVERY 12 HOURS DAILY FOR 5 DAYS. 10 tablet 0   buPROPion (WELLBUTRIN XL) 150 MG 24 hr tablet Take  1 tablet (150 mg total) by mouth once every morning. 30 tablet 3   HYDROcodone-acetaminophen (NORCO) 7.5-325 MG tablet TAKE 1 TABLET EVERY 6 HOURS AS NEEDED. 5 tablet 0   hydrOXYzine (ATARAX) 25 MG tablet Take 1 tablet (25 mg total) by mouth 3 (three) times daily as needed for anxiety. 75 tablet 1   ibuprofen (ADVIL) 600 MG tablet TAKE 1 TABLET BY MOUTH EVERY 6 HOURS AS NEEDED. 20 tablet 0   Lidocaine (SALONPAS PAIN RELIEVING) 4 % PTCH Apply 1 each topically every 12 (twelve) hours. 15 patch 0   naproxen (NAPROSYN) 375 MG tablet Take 1 tablet (375 mg total) by mouth 2 (two) times daily. 20 tablet 0   prazosin (MINIPRESS) 2 MG capsule Take 1 capsule (2 mg total) by mouth once nightly at bedtime. 30 capsule 3   QUEtiapine (SEROQUEL) 100 MG tablet Take 1 tablet (100 mg total) by mouth once nightly at bedtime. 30 tablet 3   QUEtiapine (SEROQUEL) 25 MG tablet Take 1 tablet (25 mg total) by mouth once daily. 30 tablet 3    No current facility-administered medications for this visit.     Musculoskeletal: Strength & Muscle Tone: Unable to assess due to telemedicine visit Gait & Station: Unable to assess due to telemedicine visit Patient leans: Unable to assess due to telemedicine visit  Psychiatric Specialty Exam: Review of Systems  Psychiatric/Behavioral:  Positive for sleep disturbance. Negative for decreased concentration, dysphoric mood, hallucinations, self-injury and suicidal ideas. The patient is nervous/anxious. The patient is not hyperactive.     There were no vitals taken for this visit.There is no height or weight on file to calculate BMI.  General Appearance: Unable to assess due to telemedicine visit  Eye Contact:  Unable to assess due to telemedicine visit  Speech:  Clear and Coherent and Normal Rate  Volume:  Normal  Mood:  Anxious and Depressed  Affect:  Congruent and Depressed  Thought Process:  Coherent and Descriptions of Associations: Intact  Orientation:  Full (Time, Place, and Person)  Thought Content: WDL   Suicidal Thoughts:  No  Homicidal Thoughts:  No  Memory:  Immediate;   Good Recent;   Good Remote;   Fair  Judgement:  Good  Insight:  Good  Psychomotor Activity:  Normal  Concentration:  Concentration: Good and Attention Span: Good  Recall:  Good  Fund of Knowledge: Good  Language: Good  Akathisia:  No  Handed:  Right  AIMS (if indicated): not done  Assets:  Communication Skills Desire for Improvement Housing Social Support Vocational/Educational  ADL's:  Intact  Cognition: WNL  Sleep:  Fair   Screenings: GAD-7    Flowsheet Row [facility]  Total GAD-7 Score 9 20 11 19 11       PHQ2-9    Flowsheet Row [facility]  PHQ-2 Total Score 2 4 2 4 3   PHQ-9 Total Score 6 16 7 18  14  Flowsheet Row Video Visit from 09/23/2021 in [facility] Video Visit from 07/22/2021 in [facility] ED from 07/07/2021 in [facility]  C-SSRS RISK CATEGORY Low Risk Low Risk No Risk        Assessment and Plan:   Megan Lloyd is a 46 year old female with a past psychiatric history significant for anxiety disorder, bipolar disorder, and sleep disturbances who presents to [facility] via virtual telephone visit for follow-up and medication management.  Patient reports that her depression has been minimal and states that she has been working and staying busy to avoid worsening symptoms.  Patient endorses elevated anxiety but is unable to attribute any discernible triggers.  Patient would like to continue taking her medications as prescribed with no dosage adjustments needed.  Patient's medications to be e-prescribed to pharmacy of choice.  Collaboration of Care: Collaboration of Care: Medication Management AEB provider managing patient's psychiatric medications, Psychiatrist AEB patient being followed by a mental health provider, and Referral or follow-up with counselor/therapist AEB patient being seen by a licensed clinical social worker  Patient/Guardian was advised Release of Information must be obtained prior to any record release in order to collaborate their  care with an outside provider. Patient/Guardian was advised if they have not already done so to contact the registration department to sign all necessary forms in order for [place] to release information regarding their care.   Consent: Patient/Guardian gives verbal consent for treatment and assignment of benefits for services provided during this visit. Patient/Guardian expressed understanding and agreed to proceed.   1. Generalized anxiety disorder  - QUEtiapine (SEROQUEL) 25 MG tablet; Take 1 tablet (25 mg total) by mouth once daily.  Dispense: 30 tablet; Refill: 3 - QUEtiapine (SEROQUEL) 100 MG tablet; Take 1 tablet (100 mg total) by mouth once nightly at bedtime.  Dispense: 30 tablet; Refill: 3  2. Bipolar affective disorder, current episode depressed, current episode severity unspecified (HCC)  - QUEtiapine (SEROQUEL) 100 MG tablet; Take 1 tablet (100 mg total) by mouth once nightly at bedtime.  Dispense: 30 tablet; Refill: 3 - buPROPion (WELLBUTRIN XL) 150 MG 24 hr tablet; Take 1 tablet (150 mg total) by mouth once every morning.  Dispense: 30 tablet; Refill: 3  3. Sleep disturbances  - prazosin (MINIPRESS) 2 MG capsule; Take 1 capsule (2 mg total) by mouth once nightly at bedtime.  Dispense: 30 capsule; Refill: 3  Patient to follow up in 3 months Provider spent a total of 11 minutes with the patient/reviewing patient's chart  Meta Hatchet, PA 09/26/2021, 10:42 AM

## 2021-09-24 ENCOUNTER — Other Ambulatory Visit: Payer: Self-pay

## 2021-09-25 ENCOUNTER — Encounter (HOSPITAL_COMMUNITY): Payer: Self-pay | Admitting: Physician Assistant

## 2021-10-01 ENCOUNTER — Other Ambulatory Visit: Payer: Self-pay

## 2021-11-12 ENCOUNTER — Other Ambulatory Visit: Payer: Self-pay

## 2021-12-10 ENCOUNTER — Other Ambulatory Visit: Payer: Self-pay

## 2021-12-17 ENCOUNTER — Other Ambulatory Visit: Payer: Self-pay

## 2021-12-23 ENCOUNTER — Telehealth (HOSPITAL_COMMUNITY): Payer: No Payment, Other | Admitting: Physician Assistant

## 2021-12-23 ENCOUNTER — Encounter (HOSPITAL_COMMUNITY): Payer: Self-pay

## 2022-01-12 ENCOUNTER — Other Ambulatory Visit: Payer: Self-pay

## 2022-02-12 ENCOUNTER — Other Ambulatory Visit: Payer: Self-pay

## 2022-02-25 ENCOUNTER — Ambulatory Visit (HOSPITAL_COMMUNITY): Payer: No Payment, Other | Admitting: Licensed Clinical Social Worker

## 2022-08-18 IMAGING — CT CT ABD-PELV W/ CM
2 of 5 series · 16 of 46 positions shown, 18 images · IV contrast (agent unspecified)
Comparison: None.

CLINICAL DATA: Left lower quadrant abdominal pain.

EXAM:
CT ABDOMEN AND PELVIS WITH CONTRAST
TECHNIQUE: Multidetector CT imaging of the abdomen and pelvis was performed
using the standard protocol following bolus administration of
intravenous contrast.

[Series 2: axial st · axial · 0.76mm/px · z∈[+1174,+1579]mm · 13 of 95 slices shown, 15 images]
[im 7/95  soft-tissue]
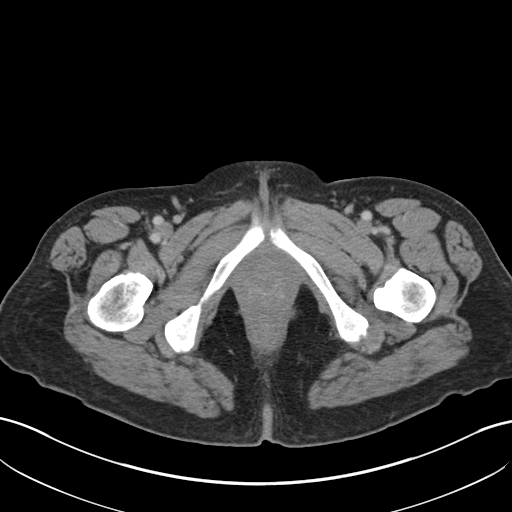
[im 7/95  bone]
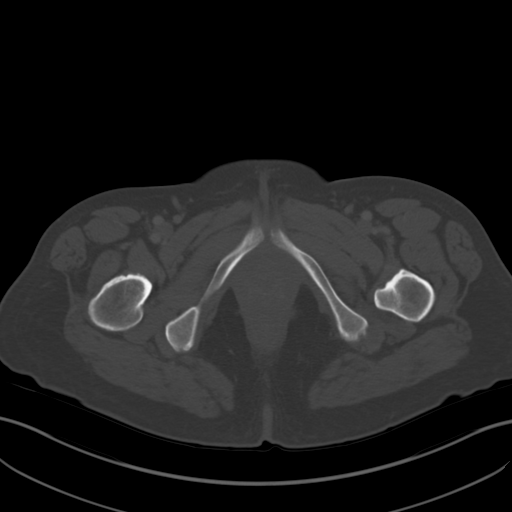
[im 13/95  soft-tissue]
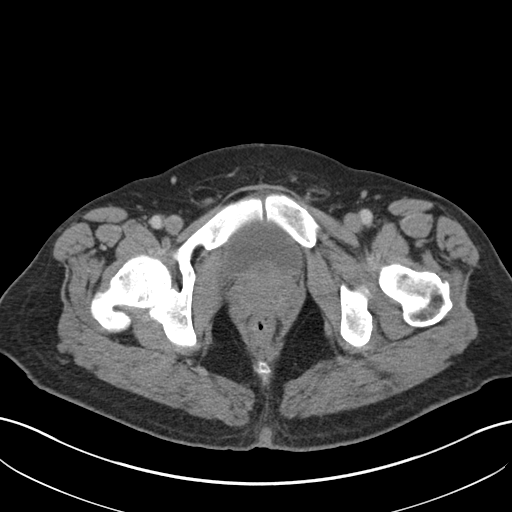
[im 19/95  soft-tissue]
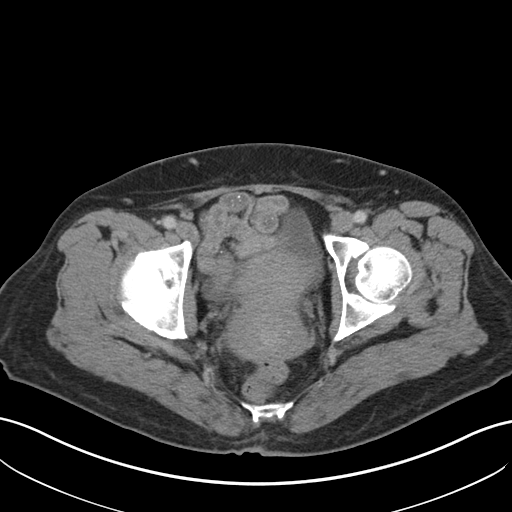
[im 26/95  soft-tissue]
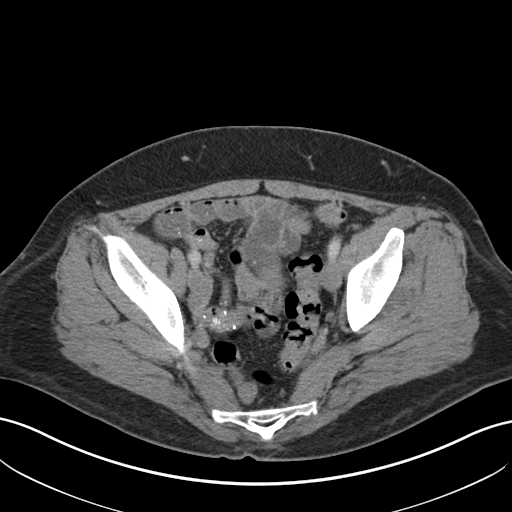
[im 32/95  soft-tissue]
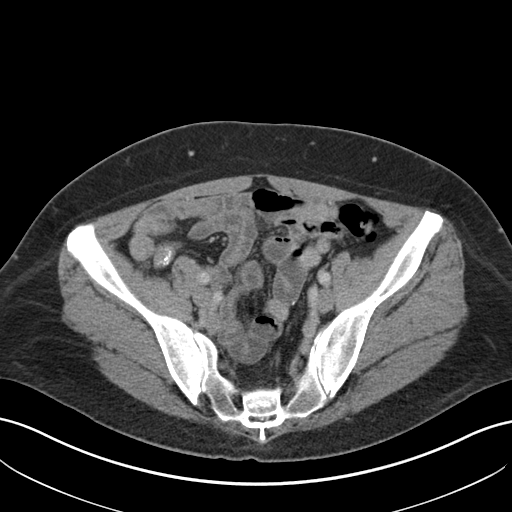
[im 38/95  soft-tissue]
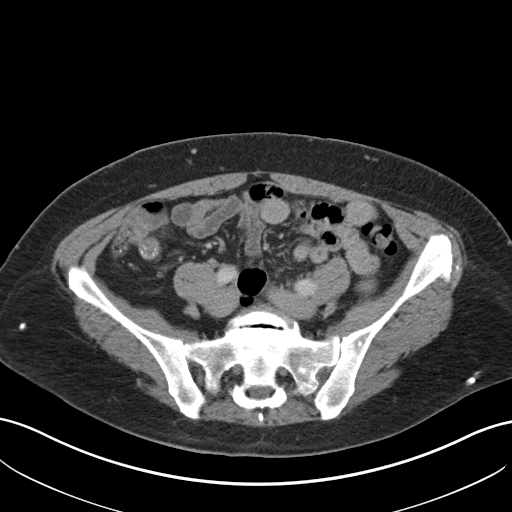
[im 51/95  soft-tissue]
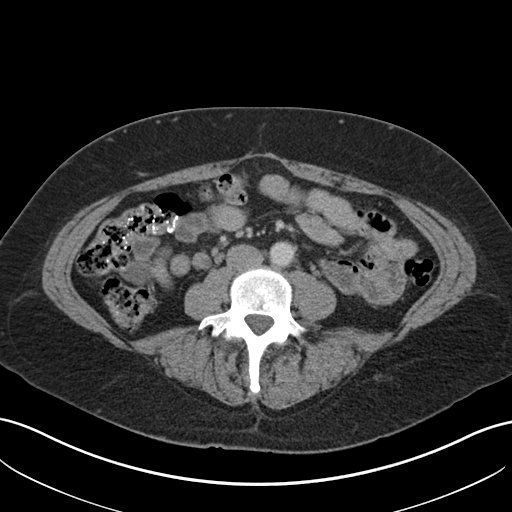
[im 57/95  soft-tissue]
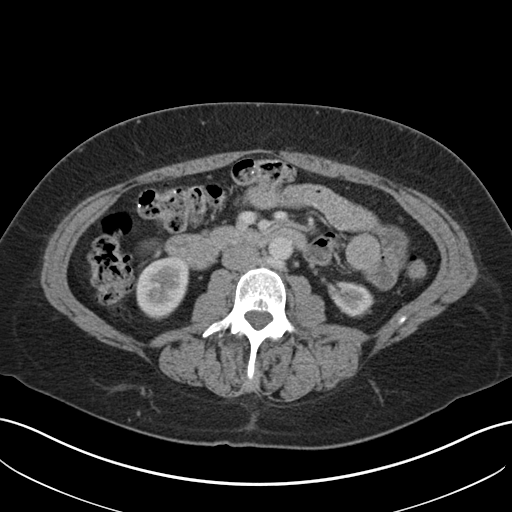
[im 63/95  soft-tissue]
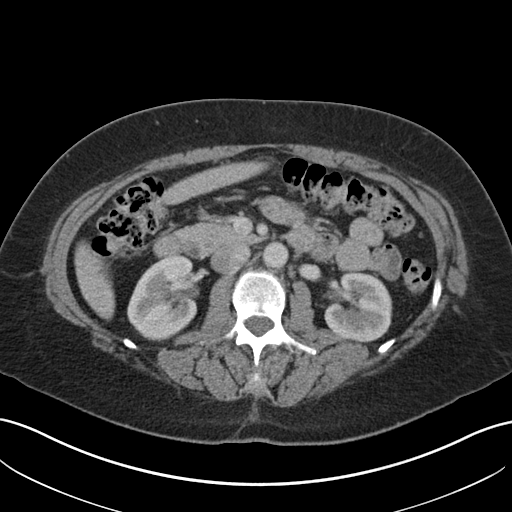
[im 63/95  bone]
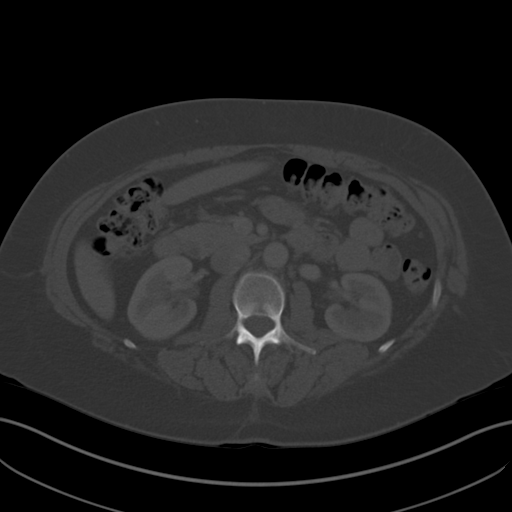
[im 69/95  soft-tissue]
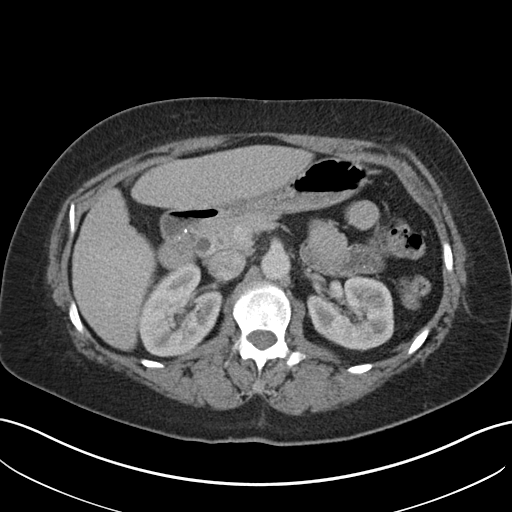
[im 76/95  soft-tissue]
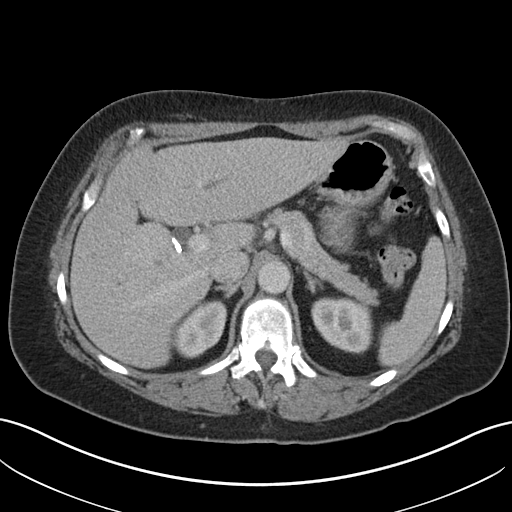
[im 82/95  soft-tissue]
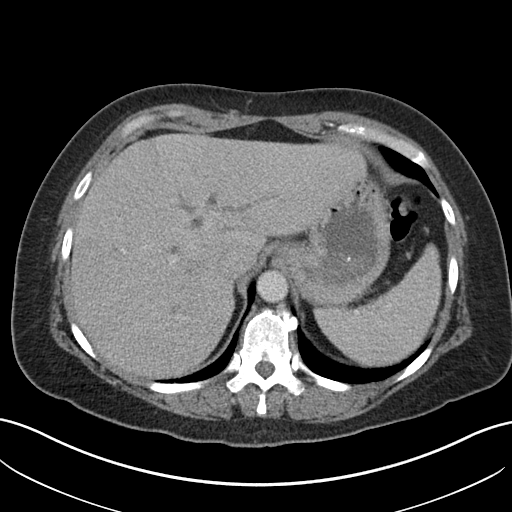
[im 88/95  soft-tissue]
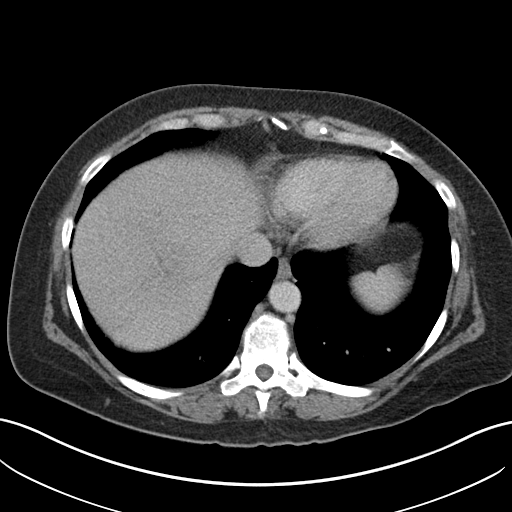

[Series 5: coronal st · coronal · 0.78mm/px · 3 of 137 slices shown]
[im 46/137  soft-tissue]
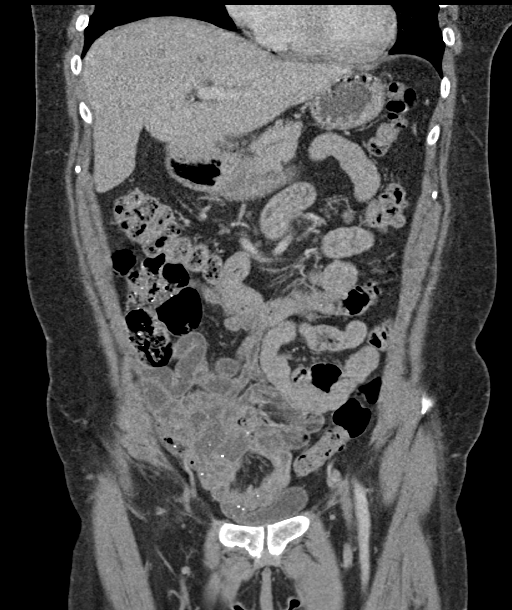
[im 61/137  soft-tissue]
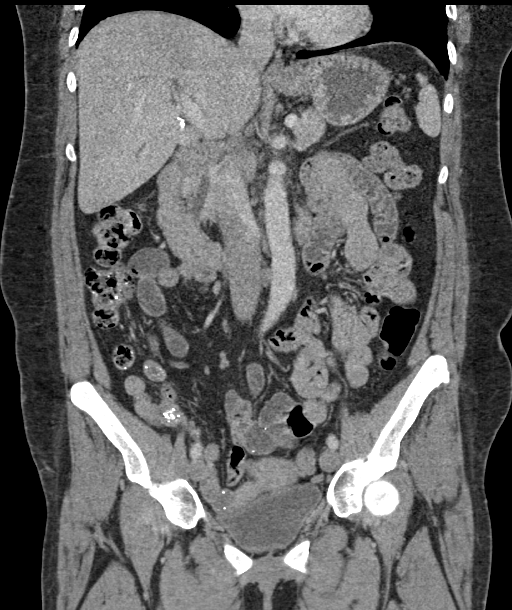
[im 76/137  soft-tissue]
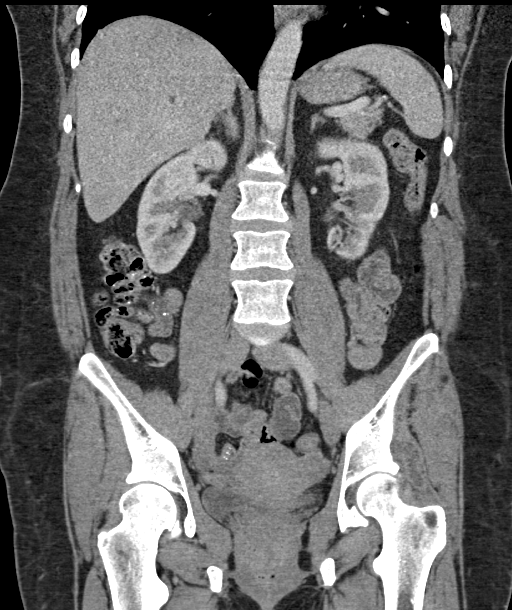

[16 of 46 positions shown; findings below may reference images not displayed]

RADIATION DOSE REDUCTION: This exam was performed according to the
departmental dose-optimization program which includes automated
exposure control, adjustment of the mA and/or kV according to
patient size and/or use of iterative reconstruction technique.

CONTRAST:  100mL OMNIPAQUE IOHEXOL 300 MG/ML  SOLN
FINDINGS: Lower chest: There is a 5 mm nodule at the right lung base
posteriorly. The visualized lung bases are otherwise clear.

No intra-abdominal free air or free fluid.

Hepatobiliary: The liver is unremarkable. There is mild intrahepatic
biliary dilatation. Cholecystectomy. No retained calcified stone
noted in the central CBD.

Pancreas: Unremarkable. No pancreatic ductal dilatation or
surrounding inflammatory changes.

Spleen: Normal in size without focal abnormality.

Adrenals/Urinary Tract: The adrenal glands unremarkable. Punctate
nonobstructing left renal inferior pole calculus. No hydronephrosis.
The right kidney, visualized ureters, and urinary bladder appear
unremarkable.

Stomach/Bowel: There is no bowel obstruction or active inflammation.
Appendectomy.

Vascular/Lymphatic: The abdominal aorta and IVC are unremarkable.
There is a retroaortic left renal vein anatomy. No portal venous
gas. There is no adenopathy.

Reproductive: The uterus is anteverted.  No adnexal masses.

Other: None

Musculoskeletal: Mild degenerative changes of the spine. No acute
osseous pathology.
IMPRESSION: 1. No acute intra-abdominal or pelvic pathology.
2. Punctate nonobstructing left renal inferior pole calculus. No
hydronephrosis.

## 2023-04-27 ENCOUNTER — Encounter (HOSPITAL_COMMUNITY): Payer: Self-pay

## 2023-04-27 ENCOUNTER — Telehealth (HOSPITAL_COMMUNITY): Payer: Self-pay

## 2023-04-27 ENCOUNTER — Ambulatory Visit (HOSPITAL_COMMUNITY): Payer: Medicaid Other

## 2023-04-27 DIAGNOSIS — F1421 Cocaine dependence, in remission: Secondary | ICD-10-CM

## 2023-04-27 DIAGNOSIS — F1221 Cannabis dependence, in remission: Secondary | ICD-10-CM

## 2023-04-27 DIAGNOSIS — F4312 Post-traumatic stress disorder, chronic: Secondary | ICD-10-CM

## 2023-04-27 DIAGNOSIS — F1521 Other stimulant dependence, in remission: Secondary | ICD-10-CM

## 2023-04-27 DIAGNOSIS — F411 Generalized anxiety disorder: Secondary | ICD-10-CM

## 2023-04-27 DIAGNOSIS — F1020 Alcohol dependence, uncomplicated: Secondary | ICD-10-CM

## 2023-04-27 DIAGNOSIS — F1591 Other stimulant use, unspecified, in remission: Secondary | ICD-10-CM

## 2023-04-27 DIAGNOSIS — F3130 Bipolar disorder, current episode depressed, mild or moderate severity, unspecified: Secondary | ICD-10-CM
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
## 2023-04-28 ENCOUNTER — Ambulatory Visit (HOSPITAL_COMMUNITY)
Admission: EM | Admit: 2023-04-28 | Discharge: 2023-04-29 | Payer: Medicaid Other | Attending: Psychiatry | Admitting: Psychiatry

## 2023-04-28 ENCOUNTER — Other Ambulatory Visit: Payer: Self-pay

## 2023-04-28 ENCOUNTER — Encounter (HOSPITAL_COMMUNITY): Payer: Self-pay

## 2023-04-28 DIAGNOSIS — F445 Conversion disorder with seizures or convulsions: Secondary | ICD-10-CM | POA: Insufficient documentation

## 2023-04-28 DIAGNOSIS — F1221 Cannabis dependence, in remission: Secondary | ICD-10-CM | POA: Insufficient documentation

## 2023-04-28 DIAGNOSIS — F1090 Alcohol use, unspecified, uncomplicated: Secondary | ICD-10-CM

## 2023-04-28 DIAGNOSIS — F3112 Bipolar disorder, current episode manic without psychotic features, moderate: Secondary | ICD-10-CM | POA: Insufficient documentation

## 2023-04-28 DIAGNOSIS — F4310 Post-traumatic stress disorder, unspecified: Secondary | ICD-10-CM

## 2023-04-28 DIAGNOSIS — Z3202 Encounter for pregnancy test, result negative: Secondary | ICD-10-CM | POA: Diagnosis not present

## 2023-04-28 DIAGNOSIS — F410 Panic disorder [episodic paroxysmal anxiety] without agoraphobia: Secondary | ICD-10-CM | POA: Insufficient documentation

## 2023-04-28 DIAGNOSIS — F1020 Alcohol dependence, uncomplicated: Secondary | ICD-10-CM | POA: Insufficient documentation

## 2023-04-28 DIAGNOSIS — F411 Generalized anxiety disorder: Secondary | ICD-10-CM | POA: Insufficient documentation

## 2023-04-28 DIAGNOSIS — F4312 Post-traumatic stress disorder, chronic: Secondary | ICD-10-CM | POA: Insufficient documentation

## 2023-04-28 DIAGNOSIS — I517 Cardiomegaly: Secondary | ICD-10-CM | POA: Insufficient documentation

## 2023-04-28 LAB — CBC WITH DIFFERENTIAL/PLATELET
Abs Immature Granulocytes: 0.02 10*3/uL (ref 0.00–0.07)
Basophils Absolute: 0 10*3/uL (ref 0.0–0.1)
Basophils Relative: 1 %
Eosinophils Absolute: 0.1 10*3/uL (ref 0.0–0.5)
Eosinophils Relative: 1 %
HCT: 50 % — ABNORMAL HIGH (ref 36.0–46.0)
Hemoglobin: 16.8 g/dL — ABNORMAL HIGH (ref 12.0–15.0)
Immature Granulocytes: 0 %
Lymphocytes Relative: 21 %
Lymphs Abs: 1.8 10*3/uL (ref 0.7–4.0)
MCH: 34.7 pg — ABNORMAL HIGH (ref 26.0–34.0)
MCHC: 33.6 g/dL (ref 30.0–36.0)
MCV: 103.3 fL — ABNORMAL HIGH (ref 80.0–100.0)
Monocytes Absolute: 0.6 10*3/uL (ref 0.1–1.0)
Monocytes Relative: 7 %
Neutro Abs: 6.2 10*3/uL (ref 1.7–7.7)
Neutrophils Relative %: 70 %
Platelets: UNDETERMINED 10*3/uL (ref 150–400)
RBC: 4.84 MIL/uL (ref 3.87–5.11)
RDW: 11.7 % (ref 11.5–15.5)
Smear Review: UNDETERMINED
WBC: 8.8 10*3/uL (ref 4.0–10.5)
nRBC: 0 % (ref 0.0–0.2)

## 2023-04-28 LAB — URINALYSIS, ROUTINE W REFLEX MICROSCOPIC
Bacteria, UA: NONE SEEN
Bilirubin Urine: NEGATIVE
Glucose, UA: NEGATIVE mg/dL
Hgb urine dipstick: NEGATIVE
Ketones, ur: NEGATIVE mg/dL
Nitrite: NEGATIVE
Protein, ur: NEGATIVE mg/dL
Specific Gravity, Urine: 1.004 — ABNORMAL LOW (ref 1.005–1.030)
pH: 8 (ref 5.0–8.0)

## 2023-04-28 LAB — COMPREHENSIVE METABOLIC PANEL
ALT: 74 U/L — ABNORMAL HIGH (ref 0–44)
AST: 57 U/L — ABNORMAL HIGH (ref 15–41)
Albumin: 4.4 g/dL (ref 3.5–5.0)
Alkaline Phosphatase: 66 U/L (ref 38–126)
Anion gap: 14 (ref 5–15)
BUN: 10 mg/dL (ref 6–20)
CO2: 28 mmol/L (ref 22–32)
Calcium: 10.2 mg/dL (ref 8.9–10.3)
Chloride: 98 mmol/L (ref 98–111)
Creatinine, Ser: 0.8 mg/dL (ref 0.44–1.00)
GFR, Estimated: >60 mL/min (ref >60)
Glucose, Bld: 109 mg/dL — ABNORMAL HIGH (ref 70–99)
Potassium: 3.4 mmol/L — ABNORMAL LOW (ref 3.5–5.1)
Sodium: 140 mmol/L (ref 135–145)
Total Bilirubin: 1.2 mg/dL (ref 0.0–1.2)
Total Protein: 7.9 g/dL (ref 6.5–8.1)

## 2023-04-28 LAB — LIPID PANEL
Cholesterol: 273 mg/dL — ABNORMAL HIGH (ref 0–200)
HDL: 74 mg/dL (ref >40)
LDL Cholesterol: 163 mg/dL — ABNORMAL HIGH (ref 0–99)
Total CHOL/HDL Ratio: 3.7 {ratio}
Triglycerides: 181 mg/dL — ABNORMAL HIGH (ref <150)
VLDL: 36 mg/dL (ref 0–40)

## 2023-04-28 LAB — PREGNANCY, URINE: Preg Test, Ur: NEGATIVE

## 2023-04-28 LAB — MAGNESIUM: Magnesium: 1.7 mg/dL (ref 1.7–2.4)

## 2023-04-28 LAB — TSH: TSH: 0.753 u[IU]/mL (ref 0.350–4.500)

## 2023-04-28 LAB — POCT URINE DRUG SCREEN - MANUAL ENTRY (I-SCREEN)
POC Amphetamine UR: NOT DETECTED
POC Buprenorphine (BUP): NOT DETECTED
POC Cocaine UR: NOT DETECTED
POC Marijuana UR: POSITIVE — AB
POC Methadone UR: NOT DETECTED
POC Methamphetamine UR: NOT DETECTED
POC Morphine: NOT DETECTED
POC Oxazepam (BZO): NOT DETECTED
POC Oxycodone UR: NOT DETECTED
POC Secobarbital (BAR): NOT DETECTED

## 2023-04-28 LAB — HEPATITIS PANEL, ACUTE
HCV Ab: REACTIVE — AB
Hep A IgM: NONREACTIVE
Hep B C IgM: NONREACTIVE
Hepatitis B Surface Ag: NONREACTIVE

## 2023-04-28 LAB — ETHANOL: Alcohol, Ethyl (B): <10 mg/dL (ref <10)

## 2023-04-28 LAB — HEMOGLOBIN A1C
Hgb A1c MFr Bld: 4.8 % (ref 4.8–5.6)
Mean Plasma Glucose: 91.06 mg/dL

## 2023-04-28 LAB — POCT PREGNANCY, URINE: Preg Test, Ur: NEGATIVE

## 2023-04-28 MED ORDER — HALOPERIDOL LACTATE 5 MG/ML IJ SOLN: 10.0000 mg | Freq: Three times a day (TID) | INTRAMUSCULAR | Status: DC | PRN

## 2023-04-28 MED ORDER — ARIPIPRAZOLE 5 MG PO TABS
5.0000 mg | ORAL_TABLET | Freq: Once | ORAL | Status: AC
  Administered 2023-04-29: 5 mg via ORAL
  Filled 2023-04-28: qty 1

## 2023-04-28 MED ORDER — ZIPRASIDONE MESYLATE 20 MG IM SOLR: 20.0000 mg | Freq: Once | INTRAMUSCULAR | Status: DC

## 2023-04-28 MED ORDER — TRAZODONE HCL 50 MG PO TABS
50.0000 mg | ORAL_TABLET | Freq: Every evening | ORAL | Status: DC | PRN
  Administered 2023-04-28: 50 mg via ORAL
  Filled 2023-04-28: qty 1

## 2023-04-28 MED ORDER — NICOTINE 21 MG/24HR TD PT24
21.0000 mg | MEDICATED_PATCH | Freq: Every day | TRANSDERMAL | Status: DC
Start: 2023-04-28 — End: 2023-04-29
  Administered 2023-04-28 – 2023-04-29 (×2): 21 mg via TRANSDERMAL
  Filled 2023-04-28 (×2): qty 1

## 2023-04-28 MED ORDER — LORAZEPAM 1 MG PO TABS: 1.0000 mg | ORAL_TABLET | Freq: Two times a day (BID) | ORAL | Status: DC

## 2023-04-28 MED ORDER — LORAZEPAM 1 MG PO TABS
1.0000 mg | ORAL_TABLET | Freq: Three times a day (TID) | ORAL | Status: DC
  Filled 2023-04-28: qty 1

## 2023-04-28 MED ORDER — LORAZEPAM 2 MG/ML IJ SOLN: 2.0000 mg | Freq: Once | INTRAMUSCULAR | Status: DC

## 2023-04-28 MED ORDER — LORAZEPAM 1 MG PO TABS: 1.0000 mg | ORAL_TABLET | Freq: Every day | ORAL | Status: DC

## 2023-04-28 MED ORDER — ADULT MULTIVITAMIN W/MINERALS CH
1.0000 | ORAL_TABLET | Freq: Every day | ORAL | Status: DC
  Administered 2023-04-28: 1 via ORAL
  Filled 2023-04-28 (×2): qty 1

## 2023-04-28 MED ORDER — HALOPERIDOL 5 MG PO TABS: 5.0000 mg | ORAL_TABLET | Freq: Three times a day (TID) | ORAL | Status: DC | PRN

## 2023-04-28 MED ORDER — HALOPERIDOL LACTATE 5 MG/ML IJ SOLN: 5.0000 mg | Freq: Three times a day (TID) | INTRAMUSCULAR | Status: DC | PRN

## 2023-04-28 MED ORDER — MAGNESIUM HYDROXIDE 400 MG/5ML PO SUSP: 30.0000 mL | Freq: Every day | ORAL | Status: DC | PRN

## 2023-04-28 MED ORDER — DIPHENHYDRAMINE HCL 50 MG PO CAPS: 50.0000 mg | ORAL_CAPSULE | Freq: Three times a day (TID) | ORAL | Status: DC | PRN

## 2023-04-28 MED ORDER — LOPERAMIDE HCL 2 MG PO CAPS: 2.0000 mg | ORAL_CAPSULE | ORAL | Status: DC | PRN

## 2023-04-28 MED ORDER — LORAZEPAM 2 MG/ML IJ SOLN: 2.0000 mg | Freq: Three times a day (TID) | INTRAMUSCULAR | Status: DC | PRN

## 2023-04-28 MED ORDER — ARIPIPRAZOLE 5 MG PO TABS: 5.0000 mg | ORAL_TABLET | Freq: Every day | ORAL | Status: DC

## 2023-04-28 MED ORDER — VITAMIN B-12 100 MCG PO TABS
100.0000 ug | ORAL_TABLET | Freq: Every day | ORAL | Status: DC
  Administered 2023-04-28: 100 ug via ORAL
  Filled 2023-04-28 (×2): qty 1

## 2023-04-28 MED ORDER — ACETAMINOPHEN 325 MG PO TABS: 650.0000 mg | ORAL_TABLET | Freq: Four times a day (QID) | ORAL | Status: DC | PRN

## 2023-04-28 MED ORDER — LORAZEPAM 1 MG PO TABS
1.0000 mg | ORAL_TABLET | Freq: Once | ORAL | Status: DC
  Filled 2023-04-28: qty 1

## 2023-04-28 MED ORDER — LORAZEPAM 1 MG PO TABS
1.0000 mg | ORAL_TABLET | Freq: Once | ORAL | Status: DC
  Administered 2023-04-28: 1 mg via ORAL

## 2023-04-28 MED ORDER — LORAZEPAM 1 MG PO TABS
1.0000 mg | ORAL_TABLET | Freq: Four times a day (QID) | ORAL | Status: DC | PRN
  Administered 2023-04-29: 1 mg via ORAL
  Filled 2023-04-28: qty 1

## 2023-04-28 MED ORDER — QUETIAPINE FUMARATE 100 MG PO TABS: 100.0000 mg | ORAL_TABLET | Freq: Every day | ORAL | Status: DC

## 2023-04-28 MED ORDER — QUETIAPINE FUMARATE 25 MG PO TABS
25.0000 mg | ORAL_TABLET | Freq: Every morning | ORAL | Status: DC
  Administered 2023-04-28: 25 mg via ORAL
  Filled 2023-04-28: qty 1

## 2023-04-28 MED ORDER — THIAMINE HCL 100 MG/ML IJ SOLN: 100.0000 mg | Freq: Once | INTRAMUSCULAR | Status: DC

## 2023-04-28 MED ORDER — DIPHENHYDRAMINE HCL 50 MG/ML IJ SOLN: 50.0000 mg | Freq: Three times a day (TID) | INTRAMUSCULAR | Status: DC | PRN

## 2023-04-28 MED ORDER — THIAMINE MONONITRATE 100 MG PO TABS
100.0000 mg | ORAL_TABLET | Freq: Every day | ORAL | Status: DC
  Filled 2023-04-28: qty 1

## 2023-04-28 MED ORDER — LORAZEPAM 1 MG PO TABS
1.0000 mg | ORAL_TABLET | Freq: Four times a day (QID) | ORAL | Status: AC
  Administered 2023-04-28 (×4): 1 mg via ORAL
  Filled 2023-04-28 (×3): qty 1

## 2023-04-28 MED ORDER — PRAZOSIN HCL 2 MG PO CAPS
2.0000 mg | ORAL_CAPSULE | Freq: Every day | ORAL | Status: DC
Start: 2023-04-28 — End: 2023-04-29
  Administered 2023-04-28: 2 mg via ORAL
  Filled 2023-04-28: qty 1

## 2023-04-28 MED ORDER — ALUM & MAG HYDROXIDE-SIMETH 200-200-20 MG/5ML PO SUSP: 30.0000 mL | ORAL | Status: DC | PRN

## 2023-04-28 MED ORDER — HYDROXYZINE HCL 25 MG PO TABS
25.0000 mg | ORAL_TABLET | Freq: Three times a day (TID) | ORAL | Status: DC | PRN
Start: 2023-04-28 — End: 2023-04-29
  Administered 2023-04-28: 25 mg via ORAL
  Filled 2023-04-28: qty 1

## 2023-04-28 MED ORDER — ONDANSETRON 4 MG PO TBDP: 4.0000 mg | ORAL_TABLET | Freq: Four times a day (QID) | ORAL | Status: DC | PRN
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
## 2023-04-28 NOTE — Telephone Encounter (Signed)
 PT came in as a walk in for medication management on 04/28/23  after receiving as CCA with Mariah Shines on 04/27/23. By the time I called her clipboard number, there were no MM slot left. I offered to make her an appointment with a provider and give her more walk in information but she became very upset and walked away.

## 2023-04-28 NOTE — Progress Notes (Signed)
   04/28/23 0815  [facility] Triage Screening (Walk-ins at [facility] only)  How Did You Hear About Us ? Self  What Is the Reason for Your Visit/Call Today? Megan Lloyd is a 48 year old female presenting to [facility] unaccompanied. Pt reports she has been off her meds for 5 months. Pt states, "I am not well mentally". Pt reports that she was supposed to be seen upstairs for medication but they had told her she cannot be seen by a provider due to no openings. Pt reports she needs to get back on the meds she was put on upstairs. Pt reports she had a few drinks yesterday. Pt reports daily alcohol  use since the last 6 months. Pt is looking to be put back on her meds, but is not looking to be admitted. Pt endorses and has a plan to end her life by drinking herself to death. Pt mentions she constantly feels like she does not want to live anymore. Pt continues to state, "I do not want to be admitted, I want my meds and I want to go home". Pt is tearful, anxious and paranoid throughout triage. Pt denies drug use, Hi and Avh.  How Long Has This Been Causing You Problems? 1-6 months  Have You Recently Had Any Thoughts About Hurting Yourself? Yes  How long ago did you have thoughts about hurting yourself? today  Are You Planning to Commit Suicide/Harm Yourself At This time? Yes  Have you Recently Had Thoughts About Hurting Someone [name]? No  Are You Planning To Harm Someone At This Time? No  Physical Abuse Denies  Verbal Abuse Denies  Sexual Abuse Denies  Exploitation of patient/patient's resources Denies  Self-Neglect Denies  Possible abuse reported to: Other (Comment)  Are you currently experiencing any auditory, visual or other hallucinations? No  Have You Used Any Alcohol  or Drugs in the Past 24 Hours? Yes  What Did You Use and How Much? yesterday, 3 margs  Do you have any current medical co-morbidities that require immediate attention? No  Clinician description of patient physical appearance/behavior: tearful, anxious,  shaking  What Do You Feel Would Help You the Most Today? Medication(s)  If access to [facility] was not available, would you have sought care in the Emergency Department? No  Determination of Need Urgent (48 hours)  Options For Referral Medication Management  Determination of Need filed? Yes

## 2023-04-28 NOTE — Discharge Instructions (Addendum)
Accepted to [facility]
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
## 2023-04-28 NOTE — ED Notes (Signed)
 Pt sleeping@this  time breathing even and unlabored will continue to monitor for safety
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
## 2023-04-28 NOTE — ED Notes (Signed)
 Pt laying on  recliner calm and cooperative watching television ans eating a snack she denies SI/HI/AVH  pt denies pain or distress will continue to monitor for safety

## 2023-04-28 NOTE — ED Notes (Addendum)
 Patient IVC and admitted into obs unit. Patient initially irritable and angry demanding to leave and refusing treatment but eventually verbally de-escalated and compliant with po meds. Admission lab work and EKG completed. Patient much calmer at this time and oriented to unit. Patient denies SI,HI, and A/V/H with no plan or intent. Patient verbalizes all understanding regarding admission process. No s/s of current distress.

## 2023-04-29 NOTE — ED Notes (Addendum)
Pt accepted to [facility] dept called for transport

## 2023-04-29 NOTE — Progress Notes (Signed)
LCSW Progress Note  [id number]   Hue Cockerill  04/29/2023  2:08 AM    Inpatient Behavioral Health Placement  Pt meets inpatient criteria per Dr. Weston Settle. There are no available beds within [facility] system per [facility] AC Kenisha Herbin,RN. Referral was sent to the following facilities;   Destination  Service Provider Address Phone [facility] [address] [place], [place] [address] [phone]  [facility] [address], [place] [address] [phone]  [facility] Patient Placement [facility], [place] [phone]  [facility] [address] [place] [address] [phone]  [facility] [address] [place] [address] [phone]  [facility] [address] [place], [place] [address] [phone]  [facility] [address] [place] [address] [phone]  [facility] [address] [place], [place] [address] [phone]  [facility] [address] [place] [address] [phone]  [facility] [address], [place] [address] [phone]  [facility] [place] [phone]  [facility] [address] [place] [address] [phone]  [facility] EFAX [address] Kar[place] Ohs [place] [phone]  [facility] [address] [place] [address] [phone]  [facility] [address] [place] [address] [phone]  [facility] [address] [place] [address] [phone]   [facility] [address], [place] [address] [phone]  [facility] [address] [place], [place] [address] [phone]  [facility] [address] [place] [address] [phone]  [facility] [address] [place] [address] [phone]  [facility]iat[facility] Inpatient [facility] [place] [phone]  [facility] [address], [place] [address] [phone]  [facility] [address], [place] [address] [phone]  [facility] [address] [place] [address] [phone]  [facility] [address] [place], [place] [address] [phone]  [facility] [address] [place] [address] [phone]  [facility] [address] [place] [address] [phone]  [facility] [address] [place] [address] [phone]  [facility] [address], [place] [address] [phone]    Situation ongoing,  CSW will follow up.    Maryjean Ka, MSW, LCSWA 04/29/2023 2:08 AM

## 2023-04-29 NOTE — Progress Notes (Signed)
Patient request to be restarted on her Seroquel.  She has not taken it in 3 or 4 months.  She normally took it PO 25 mg in the am and 100 mg at night.  She reports that she will discuss this with the am provider that sees her this morning.

## 2023-04-29 NOTE — ED Notes (Signed)
Pt is currently sleeping, no distress noted, environmental check complete, will continue to monitor patient for safety.  

## 2023-04-29 NOTE — Progress Notes (Signed)
Report called to RN Thayer Ohm at receiving facility

## 2023-04-29 NOTE — ED Notes (Signed)
Patient is anxious, agitated by one other patient on the unit and stated "I wish he would just shut up" and slammed the bathroom door. Patient is currently taking a shower. Will continue to monitor. RN notified.
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
## 2023-05-04 ENCOUNTER — Ambulatory Visit (INDEPENDENT_AMBULATORY_CARE_PROVIDER_SITE_OTHER): Payer: Medicaid Other

## 2023-05-04 DIAGNOSIS — F1020 Alcohol dependence, uncomplicated: Secondary | ICD-10-CM

## 2023-05-04 DIAGNOSIS — F3130 Bipolar disorder, current episode depressed, mild or moderate severity, unspecified: Secondary | ICD-10-CM

## 2023-05-04 NOTE — Progress Notes (Signed)
THERAPIST PROGRESS NOTE  Session Time: 3:00 pm to 3:30 pm   Virtual Visit via Video Note   I connected with Megan Lloyd at  3:pm  EST by a video enabled telemedicine application and verified that I am speaking with the correct person using two identifiers.  Megan Lloyd says she returned to this location to see a medical provider last week and when it came her turn, she was told there were no more slots.  She said she became very upset and went downstairs and they admitted her. She said she had a panic attack and "went crazy" and they IVC'd her to  [facility] where they kept her for 6 nights and 7 days. Megan Lloyd says she feels a lot more calm. She said the counselors were quite helpful to her but she did not care for the nurses.  Megan Lloyd says she liked the groups also. Megan Lloyd says she was able to realize she needed to give her money to her daughter for now as this is a trigger for her to use alcohol.  Therapist praised Megan Lloyd for her behavioral change.  She also said she is taking Megan Lloyd which is curbing the cravings for alcohol.  Therapist encouraged her to continue to take her prescribed medications.    Location: Patient: home Provider: [address] [place]   I discussed the limitations of evaluation and management by telemedicine and the availability of in person appointments. The patient expressed understanding and agreed to proceed.  Type of Therapy: Individual   Therapist Response/Interventions: CBT/Praised Nitisha for giving her money which is a trigger for her to her daughter  Goals: Problem: Substance Use Dates: Start: 04/27/23 Disciplines: Interdisciplinary, PROVIDER Goal: Megan Lloyd will abstain from alcohol and drugs for 30/30 days per months based on self reports and/or UDS and breathalyzer if indicated. Dates: Start: 04/27/23 Expected End: 09/25/23 Disciplines: Interdisciplinary, PROVIDER Goal: Megan Lloyd will decrease her anxiety and  depressive symptoms by reporting no higher than a 4 on the PHQ-9 and GAD-7. Dates: Start: 04/27/23 Expected End: 09/25/23 Disciplines: Interdisciplinary, PROVIDER Intervention: Therapist will educate Megan Lloyd about SUDS , patterns and consequences of use and/or of use, relapse risks, the treatment process, types of mutual support and provide early recovery, and relapse prevention plans. Dates: Start: 04/27/23 Intervention: Therapist will assist Megan Lloyd in identifying thoughts and behavior that led to depression and anxiety. Dates: Start: 04/27/23 Description: Megan Lloyd give verbal permission for this therapist to electronically sign her Care Plan   Progress Towards Goals: progress noted toward goal  Suicidal/Homicidal: Denies  Plan: Return again in 1 week  Diagnosis: Alcohol Use Disorder, Bipolar Affective Disorder  Collaboration of Care: n/a  Patient/Guardian was advised Release of Information must be obtained prior to any record release in order to collaborate their care with an outside provider. Patient/Guardian was advised if they have not already done so to contact the registration department to sign all necessary forms in order for [place] to release information regarding their care.   Consent: Patient/Guardian gives verbal consent for treatment and assignment of benefits for services provided during this visit. Patient/Guardian expressed understanding and agreed to proceed.   Megan Eisenmenger, MS, LMFT, LCAS    Progress Towards Goals: minimal  Suicidal/Homicidal:   Plan: Return again in [place].  Diagnosis:   Collaboration of Care: n/a  Patient/Guardian was advised Release of Information must be obtained prior to any record release in order to collaborate their care with an outside provider. Patient/Guardian was advised if they have not  already done so to contact the registration department to sign all necessary forms in order for [place] to release information regarding their care.    Consent: Patient/Guardian gives verbal consent for treatment and assignment of benefits for services provided during this visit. Patient/Guardian expressed understanding and agreed to proceed.   Megan Eisenmenger, MS, LMFT, LCAS

## 2023-05-11 ENCOUNTER — Ambulatory Visit (INDEPENDENT_AMBULATORY_CARE_PROVIDER_SITE_OTHER): Payer: Medicaid Other

## 2023-05-11 DIAGNOSIS — F3130 Bipolar disorder, current episode depressed, mild or moderate severity, unspecified: Secondary | ICD-10-CM

## 2023-05-11 DIAGNOSIS — G479 Sleep disorder, unspecified: Secondary | ICD-10-CM

## 2023-05-11 DIAGNOSIS — F411 Generalized anxiety disorder: Secondary | ICD-10-CM

## 2023-05-11 DIAGNOSIS — F1020 Alcohol dependence, uncomplicated: Secondary | ICD-10-CM

## 2023-05-11 DIAGNOSIS — F1421 Cocaine dependence, in remission: Secondary | ICD-10-CM

## 2023-05-11 DIAGNOSIS — F1521 Other stimulant dependence, in remission: Secondary | ICD-10-CM

## 2023-05-11 DIAGNOSIS — F4312 Post-traumatic stress disorder, chronic: Secondary | ICD-10-CM

## 2023-05-11 DIAGNOSIS — F1591 Other stimulant use, unspecified, in remission: Secondary | ICD-10-CM

## 2023-05-11 DIAGNOSIS — F1221 Cannabis dependence, in remission: Secondary | ICD-10-CM
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
## 2023-05-18 ENCOUNTER — Ambulatory Visit (HOSPITAL_COMMUNITY): Payer: Medicaid Other

## 2023-05-18 DIAGNOSIS — F411 Generalized anxiety disorder: Secondary | ICD-10-CM

## 2023-05-18 DIAGNOSIS — F3130 Bipolar disorder, current episode depressed, mild or moderate severity, unspecified: Secondary | ICD-10-CM

## 2023-05-18 DIAGNOSIS — F1591 Other stimulant use, unspecified, in remission: Secondary | ICD-10-CM

## 2023-05-18 DIAGNOSIS — F1521 Other stimulant dependence, in remission: Secondary | ICD-10-CM

## 2023-05-18 DIAGNOSIS — F1020 Alcohol dependence, uncomplicated: Secondary | ICD-10-CM

## 2023-05-18 DIAGNOSIS — F1421 Cocaine dependence, in remission: Secondary | ICD-10-CM

## 2023-05-18 DIAGNOSIS — F4312 Post-traumatic stress disorder, chronic: Secondary | ICD-10-CM

## 2023-05-18 NOTE — Progress Notes (Signed)
 THERAPIST PROGRESS NOTE  Session Time: 2:39 pm to 3:39 pm  Type of Therapy: Individual    Therapist Response/Interventions: CBT/ Active Listening.  Praised her for going another week without using alcohol ./Discussed sober support  Summary: She says she is feeling some better. She says her sister-in-law is flying her to [place]  for the [employer] for a vacation. She says she is not exactly sure of the date but it is in March. Therapist asks Bernette about the use of cannabis and she says that she occasionally smokes Delta 8. Jerzey says when she gets her primary doctor established she will consider quitting, but she also says she has to pass a drug test so she may have to quit.  Hallel rates her depression as a 2 and her anxiety as a 4.  She says she has been walking lately.  She says she walked up to her former job and spoke to a lady but she realized it did not end up well because of her behavior. She says she has been off her meds since October 2024.  She says money was a deterrent. She says she has someone working on her resume. She says she loves dong care giving because she likes giving back. Kadin laments on how if she would have been on her meds and going to AA, perhaps she would still have her job. Anneli recalls her grandparents and what she learned from theme about work administrator, civil service and how to treat others. She says she is grateful that they reared her.    Treatment Goals Addressed Problem: Substance Use Dates: Start: 04/27/23 Disciplines: Interdisciplinary, PROVIDER Goal: Meegan will abstain from alcohol  and drugs for 30/30 days per months based on self reports and/or UDS and breathalyzer if indicated. Dates: Start: 04/27/23 Expected End: 09/25/23 Disciplines: Interdisciplinary, PROVIDER Goal: Onetta will decrease her anxiety and depressive symptoms by reporting no higher than a 4 on the PHQ-9 and GAD-7. Dates: Start: 04/27/23 Expected End: 09/25/23 Disciplines:  Interdisciplinary, PROVIDER Intervention: Therapist will educate Leisel about SUDS , patterns and consequences of use and/or of use, relapse risks, the treatment process, types of mutual support and provide early recovery, and relapse prevention plans. Dates: Start: 04/27/23 Intervention: Therapist will assist Rogena in identifying thoughts and behavior that led to depression and anxiety. Dates: Start: 04/27/23 Description: Earlyn give verbal permission for this therapist to electronically sign her Care Plan   Progress Towards Goals: progress noted toward goal  Suicidal/Homicidal: Denies  Plan: Return again in 1 week  Diagnosis: Alcohol  Use Disorder, Bipolar Affective Disorder   Collaboration of Care: n/a  Patient/Guardian was advised Release of Information must be obtained prior to any record release in order to collaborate their care with an outside provider. Patient/Guardian was advised if they have not already done so to contact the registration department to sign all necessary forms in order for us  to release information regarding their care.   Consent: Patient/Guardian gives verbal consent for treatment and assignment of benefits for services provided during this visit. Patient/Guardian expressed understanding and agreed to proceed.   Darice Simpler, MS, LMFT, LCAS 05/18/23

## 2023-05-25 ENCOUNTER — Ambulatory Visit (HOSPITAL_COMMUNITY): Payer: Medicaid Other

## 2023-05-25 ENCOUNTER — Ambulatory Visit (HOSPITAL_COMMUNITY): Payer: Medicaid Other | Admitting: Student

## 2023-06-01 ENCOUNTER — Ambulatory Visit (HOSPITAL_COMMUNITY): Payer: Medicaid Other

## 2023-06-01 DIAGNOSIS — G479 Sleep disorder, unspecified: Secondary | ICD-10-CM

## 2023-06-01 DIAGNOSIS — F1421 Cocaine dependence, in remission: Secondary | ICD-10-CM

## 2023-06-01 DIAGNOSIS — F411 Generalized anxiety disorder: Secondary | ICD-10-CM

## 2023-06-01 DIAGNOSIS — F1221 Cannabis dependence, in remission: Secondary | ICD-10-CM

## 2023-06-01 DIAGNOSIS — F1020 Alcohol dependence, uncomplicated: Secondary | ICD-10-CM

## 2023-06-01 DIAGNOSIS — F4312 Post-traumatic stress disorder, chronic: Secondary | ICD-10-CM

## 2023-06-01 DIAGNOSIS — F3130 Bipolar disorder, current episode depressed, mild or moderate severity, unspecified: Secondary | ICD-10-CM

## 2023-06-01 NOTE — Progress Notes (Signed)
 THERAPIST PROGRESS NOTE  Session Time: 2:55 pm to 3.47 pm   Virtual Visit via Video Note   I connected with Megan Lloyd at  2:55 pm  EST by a video enabled telemedicine application and verified that I am speaking with the correct person using two identifiers.  Summary: Megan Lloyd is seen virtually today. She begins the session by saying how when she was [place] recently [place] [facility] [place] [facility]. She says they told her she was not Bipolar, rather she has Borderline personality disorder.  This therapist asks Megan Lloyd if she felt like she has a stable sense of self and she said, "I am working on it".  She says she did have a more stable sense of self before she lost the only job she has had in [place].  She says she thinks she needs an adjustment of her depressive medications. Megan Lloyd says she is on [employer] and Wellbutrin.  Megan Lloyd says she cannot seem to get well. She notes it must be her anxiety.  Megan Lloyd says she has been doing 30 minutes of workout but this morning she did more strenuous exercise to get rid of her energy. She says she is "one step from mania".  Megan Lloyd notes she plans to walk in to see a Psychiatrist next Monday at 7am.  Megan Lloyd is aware as to where to present should she feel she is in a crisis.  Megan Lloyd has a daughter who lives here.  She notes her son still will not have anything to do with her and says this started when she had it out with his "baby moma". Megan Lloyd reports she has not used alcohol since went downstairs and was transferred to the hospital.  Megan Lloyd rate of speech is rapid today, but is able to be interrupted, for a brief time, but resumes her rate of speech.  She perseverates on what occurred at her last job and reports she wishes things would have been different and she could work there again.  After 30 minutes into the session, Megan Lloyd says she needs to go back into the house.  Location: Patient: home Provider: [address] [place]   I discussed the limitations of evaluation and management by telemedicine and the availability of in person appointments. The patient expressed understanding and agreed to proceed.  Type of Therapy: Individual   Therapist Response/Interventions: CBT/ Active Listening.    Goals: Problem: Substance Use Dates: Start: 04/27/23 Disciplines: Interdisciplinary, PROVIDER Goal: Megan Lloyd will abstain from alcohol and drugs for 30/30 days per months based on self reports and/or UDS and breathalyzer if indicated. Dates: Start: 04/27/23 Expected End: 09/25/23 Disciplines: Interdisciplinary, PROVIDER Goal: Megan Lloyd will decrease her anxiety and depressive symptoms by reporting no higher than a 4 on the PHQ-9 and GAD-7. Dates: Start: 04/27/23 Expected End: 09/25/23 Disciplines: Interdisciplinary, PROVIDER Intervention: Therapist will educate Megan Lloyd about SUDS , patterns and consequences of use and/or of use, relapse risks, the treatment process, types of mutual support and provide early recovery, and relapse prevention plans. Dates: Start: 04/27/23 Intervention: Therapist will assist Megan Lloyd in identifying thoughts and behavior that led to depression and anxiety. Dates: Start: 04/27/23 Description: Megan Lloyd give verbal permission for this therapist to electronically sign her Care Plan   Progress Towards Goals: progress noted toward goal  Suicidal/Homicidal: Denies  Plan: Return again in 1 week  Diagnosis: Alcohol Use Disorder,Severe,  Bipolar Affective Disorder, PTSD, Cannabis Use Disorder, in sustained remission, Methamphetamine Use Disorder, in sustained remission, Cocaine Use Disorder, In  sustained remission  Collaboration of Care: n/a  Patient/Guardian was advised Release of Information must be obtained prior to any record release in order to collaborate their care with an outside provider. Patient/Guardian was advised if they have not already done so to contact the  registration department to sign all necessary forms in order for [place] to release information regarding their care.   Consent: Patient/Guardian gives verbal consent for treatment and assignment of benefits for services provided during this visit. Patient/Guardian expressed understanding and agreed to proceed.   Megan Eisenmenger, MS, LMFT, LCAS 06/01/23

## 2023-06-04 ENCOUNTER — Telehealth (HOSPITAL_COMMUNITY): Payer: Self-pay

## 2023-06-04 NOTE — Telephone Encounter (Signed)
 As I have never cared for this patient, you provided correct information in that she will need to come to walk-in hours or present to [facility] if she needs more immediate care. I cannot send in refills, unfortunately.  Dr. Alfonse Flavors

## 2023-06-04 NOTE — Telephone Encounter (Signed)
 Sounds good, tried to reach out to Pt no response but did leave voicemail !

## 2023-06-04 NOTE — Telephone Encounter (Signed)
 Pt states that she wants to speak to Doctor with [facility] ref[facility] on all medications regarding Bi-polar,  as symptoms could potentially get worse, and her next visit with provider is not until 03.04.2025.   App in Feb was canceled due to PT being sick that day when speaking to daughter who does have an approved consent of release. Notified pt / daughter if symptoms do get worse to go to ED down stairs, or come in Mon -Fri next week as a walk in by 7:15 Grabbing a clip board to see diff provider.

## 2023-06-04 NOTE — Telephone Encounter (Signed)
 Perfect, thanks

## 2023-06-07 ENCOUNTER — Telehealth (HOSPITAL_COMMUNITY): Payer: Self-pay

## 2023-06-07 NOTE — Telephone Encounter (Signed)
 PT called this afternoon pretty irrate - she stated that she has been out of her medication for a week and that she wants her medication. I did inform PT that her NEW PATIENT appt with Dr Alfonse Flavors was for next Tuesday - she got very irritated stating well that is not going to help me I came to you guys for help - you turned me away and set me to a nut house. ( She was referring to downstairs urgent care ). I informed PT that our providers must see her first in order to prescribe her medications - she began screaming into the phone and said I just will not come back here anymore and hung up on the conversation

## 2023-06-08 ENCOUNTER — Ambulatory Visit (HOSPITAL_COMMUNITY): Payer: Medicaid Other

## 2023-06-08 NOTE — Progress Notes (Deleted)
 Megan Lloyd was a No show for therapy. She had called front desk this week and told them to cancel all her appointments.  Remigio Eisenmenger, MS, LMFT, LCAS 06-08-23

## 2023-06-10 ENCOUNTER — Telehealth (HOSPITAL_COMMUNITY): Payer: Self-pay

## 2023-06-10 NOTE — Telephone Encounter (Signed)
 When calling the PT today to confirm the New Patient Appointment for Tuesday , the patient Stated that she was bringing her daughter because she feels as though there is a problem - PT stated that she has a feeling and doesn't know if she should come back here. PT stated she's been out of medications for over 10 days and she's in a bad place - I offered PT our urgent care information. PT again stated No I will be there with my daughter on Tuesday.

## 2023-06-15 ENCOUNTER — Telehealth (HOSPITAL_COMMUNITY): Payer: Self-pay

## 2023-06-15 ENCOUNTER — Telehealth (HOSPITAL_COMMUNITY): Payer: Self-pay | Admitting: Student

## 2023-06-15 ENCOUNTER — Encounter (HOSPITAL_COMMUNITY): Payer: Self-pay | Admitting: Student

## 2023-06-15 ENCOUNTER — Ambulatory Visit (INDEPENDENT_AMBULATORY_CARE_PROVIDER_SITE_OTHER): Payer: Medicaid Other | Admitting: Student

## 2023-06-15 ENCOUNTER — Ambulatory Visit (HOSPITAL_COMMUNITY): Payer: Medicaid Other

## 2023-06-15 ENCOUNTER — Ambulatory Visit (HOSPITAL_COMMUNITY)
Admission: EM | Admit: 2023-06-15 | Discharge: 2023-06-16 | Disposition: A | Discharge status: Other Acute Inpatient Hospital | Attending: Psychiatry | Admitting: Psychiatry

## 2023-06-15 VITALS — BP 135/92 | HR 60 | Ht 69.0 in | Wt 204.6 lb

## 2023-06-15 DIAGNOSIS — F1290 Cannabis use, unspecified, uncomplicated: Secondary | ICD-10-CM | POA: Insufficient documentation

## 2023-06-15 DIAGNOSIS — F1020 Alcohol dependence, uncomplicated: Secondary | ICD-10-CM | POA: Diagnosis not present

## 2023-06-15 DIAGNOSIS — F17210 Nicotine dependence, cigarettes, uncomplicated: Secondary | ICD-10-CM | POA: Insufficient documentation

## 2023-06-15 DIAGNOSIS — F3130 Bipolar disorder, current episode depressed, mild or moderate severity, unspecified: Secondary | ICD-10-CM

## 2023-06-15 DIAGNOSIS — Z79899 Other long term (current) drug therapy: Secondary | ICD-10-CM | POA: Insufficient documentation

## 2023-06-15 DIAGNOSIS — F3160 Bipolar disorder, current episode mixed, unspecified: Secondary | ICD-10-CM | POA: Insufficient documentation

## 2023-06-15 DIAGNOSIS — F4312 Post-traumatic stress disorder, chronic: Secondary | ICD-10-CM

## 2023-06-15 DIAGNOSIS — R45851 Suicidal ideations: Secondary | ICD-10-CM | POA: Insufficient documentation

## 2023-06-15 DIAGNOSIS — Z046 Encounter for general psychiatric examination, requested by authority: Secondary | ICD-10-CM

## 2023-06-15 DIAGNOSIS — Z91148 Patient's other noncompliance with medication regimen for other reason: Secondary | ICD-10-CM | POA: Insufficient documentation

## 2023-06-15 DIAGNOSIS — F603 Borderline personality disorder: Secondary | ICD-10-CM | POA: Insufficient documentation

## 2023-06-15 DIAGNOSIS — F339 Major depressive disorder, recurrent, unspecified: Secondary | ICD-10-CM

## 2023-06-15 DIAGNOSIS — F319 Bipolar disorder, unspecified: Secondary | ICD-10-CM

## 2023-06-15 DIAGNOSIS — F419 Anxiety disorder, unspecified: Secondary | ICD-10-CM | POA: Insufficient documentation

## 2023-06-15 DIAGNOSIS — F1421 Cocaine dependence, in remission: Secondary | ICD-10-CM

## 2023-06-15 DIAGNOSIS — F1591 Other stimulant use, unspecified, in remission: Secondary | ICD-10-CM

## 2023-06-15 DIAGNOSIS — F4310 Post-traumatic stress disorder, unspecified: Secondary | ICD-10-CM | POA: Insufficient documentation

## 2023-06-15 DIAGNOSIS — F1521 Other stimulant dependence, in remission: Secondary | ICD-10-CM

## 2023-06-15 DIAGNOSIS — F411 Generalized anxiety disorder: Secondary | ICD-10-CM

## 2023-06-15 LAB — POCT URINE DRUG SCREEN - MANUAL ENTRY (I-SCREEN)
POC Amphetamine UR: NOT DETECTED
POC Buprenorphine (BUP): NOT DETECTED
POC Cocaine UR: NOT DETECTED
POC Marijuana UR: POSITIVE — AB
POC Methadone UR: NOT DETECTED
POC Methamphetamine UR: NOT DETECTED
POC Morphine: NOT DETECTED
POC Oxazepam (BZO): NOT DETECTED
POC Oxycodone UR: NOT DETECTED
POC Secobarbital (BAR): NOT DETECTED

## 2023-06-15 LAB — COMPREHENSIVE METABOLIC PANEL
ALT: 38 U/L (ref 0–44)
AST: 33 U/L (ref 15–41)
Albumin: 4 g/dL (ref 3.5–5.0)
Alkaline Phosphatase: 59 U/L (ref 38–126)
Anion gap: 12 (ref 5–15)
BUN: 7 mg/dL (ref 6–20)
CO2: 24 mmol/L (ref 22–32)
Calcium: 9.6 mg/dL (ref 8.9–10.3)
Chloride: 103 mmol/L (ref 98–111)
Creatinine, Ser: 0.74 mg/dL (ref 0.44–1.00)
GFR, Estimated: >60 mL/min (ref >60)
Glucose, Bld: 98 mg/dL (ref 70–99)
Potassium: 4.1 mmol/L (ref 3.5–5.1)
Sodium: 139 mmol/L (ref 135–145)
Total Bilirubin: 0.6 mg/dL (ref 0.0–1.2)
Total Protein: 7.3 g/dL (ref 6.5–8.1)

## 2023-06-15 LAB — CBC WITH DIFFERENTIAL/PLATELET
Abs Immature Granulocytes: 0.04 10*3/uL (ref 0.00–0.07)
Basophils Absolute: 0 10*3/uL (ref 0.0–0.1)
Basophils Relative: 1 %
Eosinophils Absolute: 0.1 10*3/uL (ref 0.0–0.5)
Eosinophils Relative: 1 %
HCT: 47.1 % — ABNORMAL HIGH (ref 36.0–46.0)
Hemoglobin: 16.1 g/dL — ABNORMAL HIGH (ref 12.0–15.0)
Immature Granulocytes: 1 %
Lymphocytes Relative: 32 %
Lymphs Abs: 2.5 10*3/uL (ref 0.7–4.0)
MCH: 34 pg (ref 26.0–34.0)
MCHC: 34.2 g/dL (ref 30.0–36.0)
MCV: 99.6 fL (ref 80.0–100.0)
Monocytes Absolute: 0.6 10*3/uL (ref 0.1–1.0)
Monocytes Relative: 8 %
Neutro Abs: 4.6 10*3/uL (ref 1.7–7.7)
Neutrophils Relative %: 57 %
Platelets: 48 10*3/uL — ABNORMAL LOW (ref 150–400)
RBC: 4.73 MIL/uL (ref 3.87–5.11)
RDW: 11.8 % (ref 11.5–15.5)
WBC: 7.9 10*3/uL (ref 4.0–10.5)
nRBC: 0 % (ref 0.0–0.2)

## 2023-06-15 LAB — TSH: TSH: 1.439 u[IU]/mL (ref 0.350–4.500)

## 2023-06-15 LAB — ETHANOL: Alcohol, Ethyl (B): <10 mg/dL (ref <10)

## 2023-06-15 LAB — POC URINE PREG, ED: Preg Test, Ur: NEGATIVE

## 2023-06-15 MED ORDER — OLANZAPINE 10 MG IM SOLR: 10.0000 mg | Freq: Three times a day (TID) | INTRAMUSCULAR | Status: DC | PRN

## 2023-06-15 MED ORDER — AMLODIPINE BESYLATE 5 MG PO TABS
5.0000 mg | ORAL_TABLET | Freq: Every day | ORAL | Status: DC
Start: 2023-06-16 — End: 2023-06-16
  Administered 2023-06-16: 5 mg via ORAL
  Filled 2023-06-15: qty 1

## 2023-06-15 MED ORDER — GABAPENTIN 300 MG PO CAPS
600.0000 mg | ORAL_CAPSULE | Freq: Every evening | ORAL | Status: DC | PRN
  Administered 2023-06-16: 600 mg via ORAL
  Filled 2023-06-15: qty 2

## 2023-06-15 MED ORDER — QUETIAPINE FUMARATE 25 MG PO TABS: 25.0000 mg | ORAL_TABLET | Freq: Two times a day (BID) | ORAL | 1 refills | Status: DC | PRN

## 2023-06-15 MED ORDER — ALUM & MAG HYDROXIDE-SIMETH 200-200-20 MG/5ML PO SUSP: 30.0000 mL | ORAL | Status: DC | PRN

## 2023-06-15 MED ORDER — MAGNESIUM HYDROXIDE 400 MG/5ML PO SUSP: 30.0000 mL | Freq: Every day | ORAL | Status: DC | PRN

## 2023-06-15 MED ORDER — QUETIAPINE FUMARATE 100 MG PO TABS: 100.0000 mg | ORAL_TABLET | Freq: Every day | ORAL | 1 refills | Status: DC

## 2023-06-15 MED ORDER — OLANZAPINE 5 MG PO TBDP: 5.0000 mg | ORAL_TABLET | Freq: Three times a day (TID) | ORAL | Status: DC | PRN

## 2023-06-15 MED ORDER — OLANZAPINE 10 MG IM SOLR: 5.0000 mg | Freq: Three times a day (TID) | INTRAMUSCULAR | Status: DC | PRN

## 2023-06-15 MED ORDER — BUPROPION HCL ER (XL) 300 MG PO TB24
300.0000 mg | ORAL_TABLET | Freq: Every day | ORAL | Status: DC
  Administered 2023-06-16: 300 mg via ORAL
  Filled 2023-06-15: qty 1

## 2023-06-15 MED ORDER — QUETIAPINE FUMARATE 100 MG PO TABS
100.0000 mg | ORAL_TABLET | Freq: Every day | ORAL | Status: DC
  Administered 2023-06-16 (×2): 100 mg via ORAL
  Filled 2023-06-15 (×2): qty 1

## 2023-06-15 MED ORDER — NALTREXONE HCL 50 MG PO TABS: 50.0000 mg | ORAL_TABLET | Freq: Every day | ORAL | 1 refills | Status: DC

## 2023-06-15 MED ORDER — ACETAMINOPHEN 325 MG PO TABS: 650.0000 mg | ORAL_TABLET | Freq: Four times a day (QID) | ORAL | Status: DC | PRN

## 2023-06-15 MED ORDER — HYDROXYZINE HCL 25 MG PO TABS
25.0000 mg | ORAL_TABLET | Freq: Three times a day (TID) | ORAL | Status: DC | PRN
  Administered 2023-06-16 (×2): 25 mg via ORAL
  Filled 2023-06-15 (×2): qty 1

## 2023-06-15 MED ORDER — QUETIAPINE FUMARATE 25 MG PO TABS
25.0000 mg | ORAL_TABLET | Freq: Two times a day (BID) | ORAL | Status: DC | PRN
  Administered 2023-06-16 (×2): 50 mg via ORAL
  Filled 2023-06-15 (×2): qty 2
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
## 2023-06-15 NOTE — Telephone Encounter (Signed)
 Hello,    Pt is asking for refill on PSYCH- medications at earliest convince.    JNL
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
## 2023-06-15 NOTE — ED Notes (Signed)
 This nurse came over and apologized to patient for delay and offered to give her the ordered medication, however the patient is now refusing her medication stating that the noise from the other side (adolescent unit) is causing her anxiety and she doesn't want to be here.

## 2023-06-15 NOTE — ED Notes (Signed)
 The patient is noted yelling on the unit and cussing at staff.
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
## 2023-06-15 NOTE — Patient Instructions (Signed)
 [facility]  URGENT CARE: Open 24 hours per day for acute and/or urgent behavioral health concerns.   OUTPATIENT Walk-in information:  Please note, all walk-ins are first come & first serve, with limited number of availability. Therapist for therapy:  Monday, Tuesday, Wednesday & Thursday mornings Please ARRIVE at 7:00 AM for registration Will START at 8:00 AM Every 1st, 2nd & 3rd Friday of the month: Please ARRIVE at 7:00 AM for registration Will START at 1 PM - 5 PM Psychiatrist for medication management: Monday - Friday:  Please ARRIVE at 7:00 AM for registration Will START at 8:00 AM Appointments times are as follows:  - New patients get 1 hr ex: 8-9 am 9-10 & 10-11 then that's it.  - Existing pt's that are not seeing their provider will still take 1hr as they would be new to the provider that is covering walk ins that day.  - Existing follow ups get 30 mins.... (if they have been seen by the provider covering walk ins that day.)        Regretfully, due to limited availability, please be aware that you may not been seen on the same day as walk-in. Please consider making an appoint or try again. Thank you for your patience and understanding.  [facility] [place] [address] [place], [place] [address] [phone]  New patients are seen at their walk-in clinic. Walk-in hours are Monday - Friday from 8:30 am - 12:00 pm, and from 1:00 pm - 2:30 pm.   Walk-in patients are seen on a first come, first served basis, so try to arrive as early as possible for the best chance of being seen the same day.

## 2023-06-15 NOTE — ED Notes (Signed)
 Patient is noted on the unit yelling due to being upset about wanting her medication. Staff has explained to the patient that we have something going on, on the other side and we will be with her shortly.

## 2023-06-15 NOTE — ED Provider Notes (Signed)
 [facility] Continuous Assessment Admission H&P  Date: 06/16/23 Patient Name: Megan Lloyd MRN: [id number] Chief Complaint: under IVC  Diagnoses:  Final diagnoses:  Involuntary commitment  Bipolar affective disorder, remission status unspecified (HCC)  Suicidal ideation  Recurrent major depressive disorder, remission status unspecified (HCC)    HPI: Megan Lloyd, 48 y/o female with a history of alcohol abuse, bipolar disorder 1, posttraumatic stress disorder disturbance anxiety.  Presented to [facility] via [facility] under IVC. Per the IVC respondents walked into the room and had a large kitchen knife stab in the Mattress then held it to her neck.  She stated she is going to kill herself and the cat because she is a burden and does not want to live anymore.  She believes everyone is out to get her.  She is paranoid.  She drinks alcohol daily and does a CBD pen.  She has a history of illicit drug use.  She has been diagnosed with borderline personality disorder, anxiety, depression, PTSD.  Per the patient she is having problems with her medications, and she has not taken her medicine in a long time because she could not get to the clinic to get her medications refilled.  Per the patient she was hospitalized probably a month ago in the [facility].  Per the patient she does not want to go back to that facility because it was terrible.  Patient is very tearful sobbing and crying while talking.  According to patient she lives alone.  Currently was taking Wellbutrin and Seroquel but is currently out of her medicines denies access to guns.    Face-to-face evaluation of patient, patient is alert and oriented x 4, speech is clear, maintained minimal to no eye contact.  Patient is very tearful at times.  Affect congruent with mood.  Patient did endorse suicidal ideations stating that she had held a knife to her neck tonight but stated that she was not suicidal right now patient denies HI, AVH or  paranoia.  Patient stated she drinks alcohol every other day and it is because she does not have her medicines.  Patient reports she smoked marijuana and cigarettes.  Denies access to guns.  Patient does appear to be very depressed, anxious, and does appear to be in a manic state.  Writer discussed with patient that given the circumstances and her being under IVC she will be admitted for further evaluation.   Recommend inpatient admission.  Total Time spent with patient: 30 minutes  Musculoskeletal  Strength & Muscle Tone: within normal limits Gait & Station: normal Patient leans: N/A  Psychiatric Specialty Exam  Presentation General Appearance:  Casual  Eye Contact: Fleeting  Speech: Pressured  Speech Volume: Increased  Handedness: Right   Mood and Affect  Mood: Angry; Anxious; Depressed; Euthymic  Affect: Flat; Tearful   Thought Process  Thought Processes: Coherent  Descriptions of Associations:Intact  Orientation:Full (Time, Place and Person)  Thought Content:Scattered  Diagnosis of Schizophrenia or Schizoaffective disorder in past: No  Duration of Psychotic Symptoms: No data recorded Hallucinations:Hallucinations: None  Ideas of Reference:Paranoia  Suicidal Thoughts:Suicidal Thoughts: Yes, Active SI Active Intent and/or Plan: With Intent; With Plan SI Passive Intent and/or Plan: With Intent; With Plan  Homicidal Thoughts:Homicidal Thoughts: No   Sensorium  Memory: Immediate Fair  Judgment: Poor  Insight: Fair   Chartered certified accountant: Fair  Attention Span: Fair  Recall: [employer] of Knowledge: Fair  Language: Fair   Psychomotor Activity  Psychomotor Activity: Psychomotor Activity:  Normal   Assets  Assets: Desire for Improvement; Social Support; Resilience   Sleep  Sleep: Sleep: Fair Number of Hours of Sleep: 6   Nutritional Assessment (For OBS and FBC admissions only) Has the patient had a weight  loss or gain of 10 pounds or more in the last 3 months?: No Has the patient had a decrease in food intake/or appetite?: No Does the patient have dental problems?: No Does the patient have eating habits or behaviors that may be indicators of an eating disorder including binging or inducing vomiting?: No Has the patient recently lost weight without trying?: 0 Has the patient been eating poorly because of a decreased appetite?: 0 Malnutrition Screening Tool Score: 0    Physical Exam HENT:     Head: Normocephalic.     Nose: Nose normal.  Eyes:     Pupils: Pupils are equal, round, and reactive to light.  Cardiovascular:     Rate and Rhythm: Normal rate.  Pulmonary:     Effort: Pulmonary effort is normal.  Musculoskeletal:        General: Normal range of motion.     Cervical back: Normal range of motion.  Skin:    General: Skin is warm.  Neurological:     General: No focal deficit present.     Mental Status: She is alert.  Psychiatric:        Mood and Affect: Mood normal.        Behavior: Behavior normal.        Thought Content: Thought content normal.        Judgment: Judgment normal.    Review of Systems  Constitutional: Negative.   HENT: Negative.    Eyes: Negative.   Respiratory: Negative.    Cardiovascular: Negative.   Gastrointestinal: Negative.   Genitourinary: Negative.   Musculoskeletal: Negative.   Skin: Negative.   Neurological: Negative.   Psychiatric/Behavioral:  Positive for depression and suicidal ideas. The patient is nervous/anxious.     Blood pressure (!) 159/121, pulse (!) 126, temperature 97.8 F (36.6 C), temperature source Oral, resp. rate 18, SpO2 99%. There is no height or weight on file to calculate BMI.  Past Psychiatric History: Mood disorder, bipolar disorder, depression, suicidal ideation alcohol abuse  Is the patient at risk to self? Yes  Has the patient been a risk to self in the past 6 months? Yes .    Has the patient been a risk to  self within the distant past? Yes   Is the patient a risk to others? Yes   Has the patient been a risk to others in the past 6 months? No   Has the patient been a risk to others within the distant past? No   Past Medical History: See chart  Family History: Unknown  Social History: Alcohol, marijuana   Last Labs:  Admission on 06/15/2023  Component Date Value Ref Range Status   WBC 06/15/2023 7.9  4.0 - 10.5 K/uL Final   RBC 06/15/2023 4.73  3.87 - 5.11 MIL/uL Final   Hemoglobin 06/15/2023 16.1 (H)  12.0 - 15.0 g/dL Final   HCT 16/01/9603 47.1 (H)  36.0 - 46.0 % Final   MCV 06/15/2023 99.6  80.0 - 100.0 fL Final   MCH 06/15/2023 34.0  26.0 - 34.0 pg Final   MCHC 06/15/2023 34.2  30.0 - 36.0 g/dL Final   RDW 54/12/8117 11.8  11.5 - 15.5 % Final   Platelets 06/15/2023 48 (L)  150 - 400  K/uL Final   Comment: Immature Platelet Fraction may be clinically indicated, consider ordering this additional test [id number] REPEATED TO VERIFY    nRBC 06/15/2023 0.0  0.0 - 0.2 % Final   Neutrophils Relative % 06/15/2023 57  % Final   Neutro Abs 06/15/2023 4.6  1.7 - 7.7 K/uL Final   Lymphocytes Relative 06/15/2023 32  % Final   Lymphs Abs 06/15/2023 2.5  0.7 - 4.0 K/uL Final   Monocytes Relative 06/15/2023 8  % Final   Monocytes Absolute 06/15/2023 0.6  0.1 - 1.0 K/uL Final   Eosinophils Relative 06/15/2023 1  % Final   Eosinophils Absolute 06/15/2023 0.1  0.0 - 0.5 K/uL Final   Basophils Relative 06/15/2023 1  % Final   Basophils Absolute 06/15/2023 0.0  0.0 - 0.1 K/uL Final   Immature Granulocytes 06/15/2023 1  % Final   Abs Immature Granulocytes 06/15/2023 0.04  0.00 - 0.07 K/uL Final   Performed at [facility], [address] [place], [place] [address]   Sodium 06/15/2023 139  135 - 145 mmol/L Final   Potassium 06/15/2023 4.1  3.5 - 5.1 mmol/L Final   Chloride 06/15/2023 103  98 - 111 mmol/L Final   CO2 06/15/2023 24  22 - 32 mmol/L Final   Glucose, Bld 06/15/2023 98  70 - 99  mg/dL Final   Glucose reference range applies only to samples taken after fasting for at least 8 hours.   BUN 06/15/2023 7  6 - 20 mg/dL Final   Creatinine, Ser 06/15/2023 0.74  0.44 - 1.00 mg/dL Final   Calcium 56/21/3086 9.6  8.9 - 10.3 mg/dL Final   Total Protein 57/84/6962 7.3  6.5 - 8.1 g/dL Final   Albumin 95/28/4132 4.0  3.5 - 5.0 g/dL Final   AST 44/04/270 33  15 - 41 U/L Final   ALT 06/15/2023 38  0 - 44 U/L Final   Alkaline Phosphatase 06/15/2023 59  38 - 126 U/L Final   Total Bilirubin 06/15/2023 0.6  0.0 - 1.2 mg/dL Final   GFR, Estimated 06/15/2023 >60  >60 mL/min Final   Comment: (NOTE) Calculated using the CKD-EPI Creatinine Equation (2021)    Anion gap 06/15/2023 12  5 - 15 Final   Performed at [facility], [address] [place], [place] [address]   Alcohol, Ethyl (B) 06/15/2023 <10  <10 mg/dL Final   Comment: (NOTE) Lowest detectable limit for serum alcohol is 10 mg/dL.  For medical purposes only. Performed at [facility], [address] [place], [place] [address]    TSH 06/15/2023 1.439  0.350 - 4.500 uIU/mL Final   Comment: Performed by a 3rd Generation assay with a functional sensitivity of <=0.01 uIU/mL. Performed at [facility], [address], [place], [place] [address]    Preg Test, Ur 06/15/2023 Negative  Negative Final   POC Amphetamine UR 06/15/2023 None Detected  NONE DETECTED (Cut Off Level 1000 ng/mL) Final   POC Secobarbital (BAR) 06/15/2023 None Detected  NONE DETECTED (Cut Off Level 300 ng/mL) Final   POC Buprenorphine (BUP) 06/15/2023 None Detected  NONE DETECTED (Cut Off Level 10 ng/mL) Final   POC Oxazepam (BZO) 06/15/2023 None Detected  NONE DETECTED (Cut Off Level 300 ng/mL) Final   POC Cocaine UR 06/15/2023 None Detected  NONE DETECTED (Cut Off Level 300 ng/mL) Final   POC Methamphetamine UR 06/15/2023 None Detected  NONE DETECTED (Cut Off Level 1000 ng/mL) Final   POC  Morphine 06/15/2023 None Detected  NONE DETECTED  (Cut Off Level 300 ng/mL) Final   POC Methadone UR 06/15/2023 None Detected  NONE DETECTED (Cut Off Level 300 ng/mL) Final   POC Oxycodone UR 06/15/2023 None Detected  NONE DETECTED (Cut Off Level 100 ng/mL) Final   POC Marijuana UR 06/15/2023 Positive (A)  NONE DETECTED (Cut Off Level 50 ng/mL) Final  Admission on 04/28/2023, Discharged on 04/29/2023  Component Date Value Ref Range Status   WBC 04/28/2023 8.8  4.0 - 10.5 K/uL Final   RBC 04/28/2023 4.84  3.87 - 5.11 MIL/uL Final   Hemoglobin 04/28/2023 16.8 (H)  12.0 - 15.0 g/dL Final   HCT 16/01/9603 50.0 (H)  36.0 - 46.0 % Final   MCV 04/28/2023 103.3 (H)  80.0 - 100.0 fL Final   MCH 04/28/2023 34.7 (H)  26.0 - 34.0 pg Final   MCHC 04/28/2023 33.6  30.0 - 36.0 g/dL Final   RDW 54/12/8117 11.7  11.5 - 15.5 % Final   Platelets 04/28/2023 PLATELET CLUMPS NOTED ON SMEAR, UNABLE TO ESTIMATE  150 - 400 K/uL Final   nRBC 04/28/2023 0.0  0.0 - 0.2 % Final   Neutrophils Relative % 04/28/2023 70  % Final   Neutro Abs 04/28/2023 6.2  1.7 - 7.7 K/uL Final   Lymphocytes Relative 04/28/2023 21  % Final   Lymphs Abs 04/28/2023 1.8  0.7 - 4.0 K/uL Final   Monocytes Relative 04/28/2023 7  % Final   Monocytes Absolute 04/28/2023 0.6  0.1 - 1.0 K/uL Final   Eosinophils Relative 04/28/2023 1  % Final   Eosinophils Absolute 04/28/2023 0.1  0.0 - 0.5 K/uL Final   Basophils Relative 04/28/2023 1  % Final   Basophils Absolute 04/28/2023 0.0  0.0 - 0.1 K/uL Final   WBC Morphology 04/28/2023 MORPHOLOGY UNREMARKABLE   Final   RBC Morphology 04/28/2023 MORPHOLOGY UNREMARKABLE   Final   Smear Review 04/28/2023 PLATELET CLUMPS NOTED ON SMEAR, UNABLE TO ESTIMATE   Final   Immature Granulocytes 04/28/2023 0  % Final   Abs Immature Granulocytes 04/28/2023 0.02  0.00 - 0.07 K/uL Final   Performed at [facility], [address] [place], [place] [address]   Sodium 04/28/2023 140  135 - 145 mmol/L Final   Potassium 04/28/2023 3.4 (L)  3.5 - 5.1 mmol/L Final    Chloride 04/28/2023 98  98 - 111 mmol/L Final   CO2 04/28/2023 28  22 - 32 mmol/L Final   Glucose, Bld 04/28/2023 109 (H)  70 - 99 mg/dL Final   Glucose reference range applies only to samples taken after fasting for at least 8 hours.   BUN 04/28/2023 10  6 - 20 mg/dL Final   Creatinine, Ser 04/28/2023 0.80  0.44 - 1.00 mg/dL Final   Calcium 95/62/1308 10.2  8.9 - 10.3 mg/dL Final   Total Protein 65/78/4696 7.9  6.5 - 8.1 g/dL Final   Albumin 29/52/8413 4.4  3.5 - 5.0 g/dL Final   AST 24/40/1027 57 (H)  15 - 41 U/L Final   ALT 04/28/2023 74 (H)  0 - 44 U/L Final   Alkaline Phosphatase 04/28/2023 66  38 - 126 U/L Final   Total Bilirubin 04/28/2023 1.2  0.0 - 1.2 mg/dL Final   GFR, Estimated 04/28/2023 >60  >60 mL/min Final   Comment: (NOTE) Calculated using the CKD-EPI Creatinine Equation (2021)    Anion gap 04/28/2023 14  5 - 15 Final   Performed at [facility]  [facility], [address] [place], [place] [address]   Hgb A1c MFr Bld 04/28/2023 4.8  4.8 - 5.6 % Final   Comment: (NOTE) Pre diabetes:          5.7%-6.4%  Diabetes:              >6.4%  Glycemic control for   <7.0% adults with diabetes    Mean Plasma Glucose 04/28/2023 91.06  mg/dL Final   Performed at [facility], [address] [place], [place] [address]   Alcohol, Ethyl (B) 04/28/2023 <10  <10 mg/dL Final   Comment: (NOTE) Lowest detectable limit for serum alcohol is 10 mg/dL.  For medical purposes only. Performed at [facility], [address], [place], [place] [address]    Magnesium 04/28/2023 1.7  1.7 - 2.4 mg/dL Final   Performed at [facility], [address], [place], [place] [address]   Hepatitis B Surface Ag 04/28/2023 NON REACTIVE  NON REACTIVE Final   HCV Ab 04/28/2023 Reactive (A)  NON REACTIVE Final   Comment: (NOTE) The CDC recommends that a Reactive HCV antibody result be followed up  with a HCV Nucleic Acid Amplification test.     Hep A IgM 04/28/2023 NON REACTIVE  NON  REACTIVE Final   Hep B C IgM 04/28/2023 NON REACTIVE  NON REACTIVE Final   Performed at [facility], [address], [place], [place] [address]   Color, Urine 04/28/2023 STRAW (A)  YELLOW Final   APPearance 04/28/2023 CLEAR  CLEAR Final   Specific Gravity, Urine 04/28/2023 1.004 (L)  1.005 - 1.030 Final   pH 04/28/2023 8.0  5.0 - 8.0 Final   Glucose, UA 04/28/2023 NEGATIVE  NEGATIVE mg/dL Final   Hgb urine dipstick 04/28/2023 NEGATIVE  NEGATIVE Final   Bilirubin Urine 04/28/2023 NEGATIVE  NEGATIVE Final   Ketones, ur 04/28/2023 NEGATIVE  NEGATIVE mg/dL Final   Protein, ur 13/11/6576 NEGATIVE  NEGATIVE mg/dL Final   Nitrite 46/96/2952 NEGATIVE  NEGATIVE Final   Leukocytes,Ua 04/28/2023 TRACE (A)  NEGATIVE Final   RBC / HPF 04/28/2023 0-5  0 - 5 RBC/hpf Final   WBC, UA 04/28/2023 0-5  0 - 5 WBC/hpf Final   Bacteria, UA 04/28/2023 NONE SEEN  NONE SEEN Final   Squamous Epithelial / HPF 04/28/2023 0-5  0 - 5 /HPF Final   Performed at [facility], [address] [place], [place] [address]   POC Amphetamine UR 04/28/2023 None Detected  NONE DETECTED (Cut Off Level 1000 ng/mL) Final   POC Secobarbital (BAR) 04/28/2023 None Detected  NONE DETECTED (Cut Off Level 300 ng/mL) Final   POC Buprenorphine (BUP) 04/28/2023 None Detected  NONE DETECTED (Cut Off Level 10 ng/mL) Final   POC Oxazepam (BZO) 04/28/2023 None Detected  NONE DETECTED (Cut Off Level 300 ng/mL) Final   POC Cocaine UR 04/28/2023 None Detected  NONE DETECTED (Cut Off Level 300 ng/mL) Final   POC Methamphetamine UR 04/28/2023 None Detected  NONE DETECTED (Cut Off Level 1000 ng/mL) Final   POC Morphine 04/28/2023 None Detected  NONE DETECTED (Cut Off Level 300 ng/mL) Final   POC Methadone UR 04/28/2023 None Detected  NONE DETECTED (Cut Off Level 300 ng/mL) Final   POC Oxycodone UR 04/28/2023 None Detected  NONE DETECTED (Cut Off Level 100 ng/mL) Final   POC Marijuana UR 04/28/2023 Positive (A)  NONE DETECTED (Cut Off  Level 50 ng/mL) Final   Preg Test, Ur 04/28/2023 NEGATIVE  NEGATIVE Final   Comment:        THE SENSITIVITY OF THIS METHODOLOGY IS >25 mIU/mL. Performed at [facility], [address], [place], [place] [address]    Preg Test, Ur 04/28/2023 NEGATIVE  NEGATIVE Final   Comment:        THE SENSITIVITY OF THIS METHODOLOGY IS >24 mIU/mL    Cholesterol 04/28/2023 273 (H)  0 - 200 mg/dL Final   Triglycerides 30/86/5784 181 (H)  <150 mg/dL Final   HDL 69/62/9528 74  >40 mg/dL Final   Total CHOL/HDL Ratio 04/28/2023 3.7  RATIO Final   VLDL 04/28/2023 36  0 - 40 mg/dL Final   LDL Cholesterol 04/28/2023 163 (H)  0 - 99 mg/dL Final   Comment:        Total Cholesterol/HDL:CHD Risk Coronary Heart Disease Risk Table                     Men   Women  1/2 Average Risk   3.4   3.3  Average Risk       5.0   4.4  2 X Average Risk   9.6   7.1  3 X Average Risk  23.4   11.0        Use the calculated Patient Ratio above and the CHD Risk Table to determine the patient's CHD Risk.        ATP III CLASSIFICATION (LDL):  <100     mg/dL   Optimal  413-244  mg/dL   Near or Above                    Optimal  130-159  mg/dL   Borderline  010-272  mg/dL   High  >536     mg/dL   Very High Performed at [facility], [address], [place], [place] [address]    TSH 04/28/2023 0.753  0.350 - 4.500 uIU/mL Final   Comment: Performed by a 3rd Generation assay with a functional sensitivity of <=0.01 uIU/mL. Performed at [facility], [address], [place], [place] [address]     Allergies: Patient has no known allergies.  Medications:  Facility Ordered Medications  Medication   acetaminophen (TYLENOL) tablet 650 mg   alum & mag hydroxide-simeth (MAALOX/MYLANTA) 200-200-20 MG/5ML suspension 30 mL   magnesium hydroxide (MILK OF MAGNESIA) suspension 30 mL   OLANZapine zydis (ZYPREXA) disintegrating tablet 5 mg   OLANZapine (ZYPREXA) injection 5 mg   OLANZapine (ZYPREXA) injection 10 mg    QUEtiapine (SEROQUEL) tablet 25-50 mg   QUEtiapine (SEROQUEL) tablet 100 mg   hydrOXYzine (ATARAX) tablet 25 mg   gabapentin (NEURONTIN) capsule 600 mg   buPROPion (WELLBUTRIN XL) 24 hr tablet 300 mg   amLODipine (NORVASC) tablet 5 mg   ibuprofen (ADVIL) tablet 400 mg   PTA Medications  Medication Sig   gabapentin (NEURONTIN) 600 MG tablet Take 600 mg by mouth at bedtime as needed (For sciatica). (Patient not taking: Reported on 04/28/2023)   amLODipine (NORVASC) 5 MG tablet Take 1 tablet by mouth daily. (Patient not taking: Reported on 04/28/2023)   valsartan (DIOVAN) 80 MG tablet Take 1 tablet by mouth daily. (Patient not taking: Reported on 04/28/2023)   GuanFACINE HCl 3 MG TB24 Take 1 tablet by mouth daily. (Patient not taking: Reported on 04/28/2023)   hydrOXYzine (VISTARIL) 25 MG capsule Take 25 mg by mouth 3 (three) times daily as needed for anxiety. (Patient not  taking: Reported on 04/28/2023)   buPROPion (WELLBUTRIN XL) 300 MG 24 hr tablet Take 300 mg by mouth daily. (Patient not taking: Reported on 04/28/2023)      Medical Decision Making  Inpatient observation      Recommendations  Based on my evaluation the patient does not appear to have an emergency medical condition.  [name], NP 06/16/23  4:36 AM

## 2023-06-16 ENCOUNTER — Other Ambulatory Visit (HOSPITAL_COMMUNITY)
Admission: EM | Admit: 2023-06-16 | Discharge: 2023-06-17 | Disposition: A | Discharge status: Other Acute Inpatient Hospital | Source: Home / Self Care | Admitting: Psychiatry

## 2023-06-16 ENCOUNTER — Encounter (HOSPITAL_COMMUNITY): Payer: Self-pay | Admitting: Behavioral Health

## 2023-06-16 DIAGNOSIS — F1010 Alcohol abuse, uncomplicated: Secondary | ICD-10-CM | POA: Insufficient documentation

## 2023-06-16 DIAGNOSIS — F419 Anxiety disorder, unspecified: Secondary | ICD-10-CM | POA: Diagnosis not present

## 2023-06-16 DIAGNOSIS — F329 Major depressive disorder, single episode, unspecified: Secondary | ICD-10-CM | POA: Diagnosis present

## 2023-06-16 DIAGNOSIS — F1090 Alcohol use, unspecified, uncomplicated: Secondary | ICD-10-CM

## 2023-06-16 DIAGNOSIS — R45851 Suicidal ideations: Secondary | ICD-10-CM | POA: Insufficient documentation

## 2023-06-16 DIAGNOSIS — F3160 Bipolar disorder, current episode mixed, unspecified: Secondary | ICD-10-CM | POA: Diagnosis not present

## 2023-06-16 DIAGNOSIS — F3130 Bipolar disorder, current episode depressed, mild or moderate severity, unspecified: Secondary | ICD-10-CM | POA: Insufficient documentation

## 2023-06-16 DIAGNOSIS — F4310 Post-traumatic stress disorder, unspecified: Secondary | ICD-10-CM | POA: Diagnosis not present

## 2023-06-16 MED ORDER — ADULT MULTIVITAMIN W/MINERALS CH: 1.0000 | ORAL_TABLET | Freq: Every day | ORAL | Status: DC

## 2023-06-16 MED ORDER — OLANZAPINE 5 MG PO TBDP: 5.0000 mg | ORAL_TABLET | Freq: Three times a day (TID) | ORAL | Status: DC | PRN

## 2023-06-16 MED ORDER — HYDROXYZINE HCL 25 MG PO TABS: 25.0000 mg | ORAL_TABLET | Freq: Four times a day (QID) | ORAL | Status: DC | PRN

## 2023-06-16 MED ORDER — DIPHENHYDRAMINE HCL 50 MG PO CAPS: 50.0000 mg | ORAL_CAPSULE | Freq: Three times a day (TID) | ORAL | Status: DC | PRN

## 2023-06-16 MED ORDER — DIPHENHYDRAMINE HCL 50 MG/ML IJ SOLN: 50.0000 mg | Freq: Three times a day (TID) | INTRAMUSCULAR | Status: DC | PRN

## 2023-06-16 MED ORDER — ALUM & MAG HYDROXIDE-SIMETH 200-200-20 MG/5ML PO SUSP: 30.0000 mL | ORAL | Status: DC | PRN

## 2023-06-16 MED ORDER — HALOPERIDOL 5 MG PO TABS: 5.0000 mg | ORAL_TABLET | Freq: Three times a day (TID) | ORAL | Status: DC | PRN

## 2023-06-16 MED ORDER — LORAZEPAM 1 MG PO TABS: 1.0000 mg | ORAL_TABLET | Freq: Three times a day (TID) | ORAL | Status: DC

## 2023-06-16 MED ORDER — LORAZEPAM 1 MG PO TABS
1.0000 mg | ORAL_TABLET | Freq: Four times a day (QID) | ORAL | Status: DC
  Administered 2023-06-16 (×3): 1 mg via ORAL
  Filled 2023-06-16 (×3): qty 1

## 2023-06-16 MED ORDER — THIAMINE MONONITRATE 100 MG PO TABS: 100.0000 mg | ORAL_TABLET | Freq: Every day | ORAL | Status: DC

## 2023-06-16 MED ORDER — LOPERAMIDE HCL 2 MG PO CAPS: 2.0000 mg | ORAL_CAPSULE | ORAL | Status: DC | PRN

## 2023-06-16 MED ORDER — LORAZEPAM 1 MG PO TABS: 1.0000 mg | ORAL_TABLET | Freq: Two times a day (BID) | ORAL | Status: DC

## 2023-06-16 MED ORDER — LORAZEPAM 1 MG PO TABS: 1.0000 mg | ORAL_TABLET | Freq: Four times a day (QID) | ORAL | Status: DC | PRN

## 2023-06-16 MED ORDER — OLANZAPINE 10 MG IM SOLR: 5.0000 mg | Freq: Three times a day (TID) | INTRAMUSCULAR | Status: DC | PRN

## 2023-06-16 MED ORDER — ADULT MULTIVITAMIN W/MINERALS CH
1.0000 | ORAL_TABLET | Freq: Every day | ORAL | Status: DC
  Administered 2023-06-16: 1 via ORAL
  Filled 2023-06-16: qty 1

## 2023-06-16 MED ORDER — OLANZAPINE 10 MG IM SOLR: 10.0000 mg | Freq: Three times a day (TID) | INTRAMUSCULAR | Status: DC | PRN

## 2023-06-16 MED ORDER — LORAZEPAM 1 MG PO TABS: 1.0000 mg | ORAL_TABLET | Freq: Four times a day (QID) | ORAL | Status: DC

## 2023-06-16 MED ORDER — ACETAMINOPHEN 325 MG PO TABS: 650.0000 mg | ORAL_TABLET | Freq: Four times a day (QID) | ORAL | Status: DC | PRN

## 2023-06-16 MED ORDER — MAGNESIUM HYDROXIDE 400 MG/5ML PO SUSP: 30.0000 mL | Freq: Every day | ORAL | Status: DC | PRN

## 2023-06-16 MED ORDER — ONDANSETRON 4 MG PO TBDP: 4.0000 mg | ORAL_TABLET | Freq: Four times a day (QID) | ORAL | Status: DC | PRN

## 2023-06-16 MED ORDER — NALTREXONE HCL 50 MG PO TABS
50.0000 mg | ORAL_TABLET | Freq: Every day | ORAL | Status: DC
  Administered 2023-06-16: 50 mg via ORAL
  Filled 2023-06-16: qty 1

## 2023-06-16 MED ORDER — IBUPROFEN 400 MG PO TABS
400.0000 mg | ORAL_TABLET | Freq: Once | ORAL | Status: AC
  Administered 2023-06-16: 400 mg via ORAL
  Filled 2023-06-16: qty 1

## 2023-06-16 MED ORDER — HALOPERIDOL LACTATE 5 MG/ML IJ SOLN: 5.0000 mg | Freq: Three times a day (TID) | INTRAMUSCULAR | Status: DC | PRN

## 2023-06-16 MED ORDER — LORAZEPAM 1 MG PO TABS: 1.0000 mg | ORAL_TABLET | Freq: Every day | ORAL | Status: DC

## 2023-06-16 MED ORDER — LORAZEPAM 2 MG/ML IJ SOLN: 2.0000 mg | Freq: Three times a day (TID) | INTRAMUSCULAR | Status: DC | PRN

## 2023-06-16 MED ORDER — TRAZODONE HCL 50 MG PO TABS: 50.0000 mg | ORAL_TABLET | Freq: Every evening | ORAL | Status: DC | PRN

## 2023-06-16 MED ORDER — HALOPERIDOL LACTATE 5 MG/ML IJ SOLN: 10.0000 mg | Freq: Three times a day (TID) | INTRAMUSCULAR | Status: DC | PRN

## 2023-06-16 NOTE — ED Notes (Signed)
 Patient came to the nurses station visibly upset with staff yelling and fussing, demanding that we open the bathroom door. When staff advised the patient that we were going to open the door patient gave staff the middle finger.

## 2023-06-16 NOTE — ED Notes (Signed)
 Patient is noted on unit yelling because staff is talking. Patient has been advised that staff has to communicate with each other as we are still admitting patients and having to discuss what is needed for the patients.

## 2023-06-16 NOTE — ED Notes (Signed)
 Pt is currently sleeping, no distress noted, environmental check complete, will continue to monitor patient for safety.

## 2023-06-16 NOTE — Progress Notes (Signed)
 Pt has been accepted to [facility] on 06/16/2023 Bed assignment: 303-2 TONIGHT AC WILL UPDATE   Pt meets inpatient criteria per: [name] NP   Attending Physician will be: Dr. Sarita Bottom, MD   Report can be called to: Adult unit: [phone]  Pt can arrive afterTONIGHT [facility] WILL UPDATE    Care Team Notified: [facility] Rona Ravens RN, Bedelia Person RN, Thurston Hole NP    [place] Skylinn Vialpando LCSW-A   06/16/2023 12:43 PM
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
## 2023-06-16 NOTE — ED Notes (Signed)
 Pt has EKG order in, when staff went to get her for it, pt stated, she will like to do it in the morning when she wakes up.
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
## 2023-06-16 NOTE — Telephone Encounter (Signed)
 Her medications were sent to her preferred pharmacy.  Thank you.  Dr. Alfonse Flavors

## 2023-06-16 NOTE — ED Notes (Signed)
 Patient observed/assessed in bed/chair resting quietly appearing in no distress and verbalizing no complaints at this time. Will continue to monitor.

## 2023-06-16 NOTE — Telephone Encounter (Signed)
 Attempted call.  No answer.  HIPAA compliant voicemail left.  We will attempt to call again tomorrow.  Dr. Alfonse Flavors

## 2023-06-17 ENCOUNTER — Other Ambulatory Visit: Payer: Self-pay

## 2023-06-17 ENCOUNTER — Inpatient Hospital Stay (HOSPITAL_COMMUNITY)
Admission: AD | Admit: 2023-06-17 | Discharge: 2023-06-28 | DRG: 885 | Disposition: A | Discharge status: Home / Self Care | Source: Intra-hospital | Attending: Psychiatry | Admitting: Psychiatry

## 2023-06-17 ENCOUNTER — Encounter (HOSPITAL_COMMUNITY): Payer: Self-pay | Admitting: Student in an Organized Health Care Education/Training Program

## 2023-06-17 DIAGNOSIS — F1210 Cannabis abuse, uncomplicated: Secondary | ICD-10-CM | POA: Diagnosis present

## 2023-06-17 DIAGNOSIS — Z62810 Personal history of physical and sexual abuse in childhood: Secondary | ICD-10-CM | POA: Diagnosis not present

## 2023-06-17 DIAGNOSIS — F4312 Post-traumatic stress disorder, chronic: Secondary | ICD-10-CM | POA: Insufficient documentation

## 2023-06-17 DIAGNOSIS — R45851 Suicidal ideations: Secondary | ICD-10-CM | POA: Diagnosis present

## 2023-06-17 DIAGNOSIS — F17290 Nicotine dependence, other tobacco product, uncomplicated: Secondary | ICD-10-CM | POA: Diagnosis present

## 2023-06-17 DIAGNOSIS — Z9151 Personal history of suicidal behavior: Secondary | ICD-10-CM | POA: Diagnosis not present

## 2023-06-17 DIAGNOSIS — K219 Gastro-esophageal reflux disease without esophagitis: Secondary | ICD-10-CM | POA: Diagnosis present

## 2023-06-17 DIAGNOSIS — Z560 Unemployment, unspecified: Secondary | ICD-10-CM

## 2023-06-17 DIAGNOSIS — K5900 Constipation, unspecified: Secondary | ICD-10-CM | POA: Diagnosis not present

## 2023-06-17 DIAGNOSIS — Z5941 Food insecurity: Secondary | ICD-10-CM | POA: Diagnosis not present

## 2023-06-17 DIAGNOSIS — F10239 Alcohol dependence with withdrawal, unspecified: Secondary | ICD-10-CM | POA: Diagnosis present

## 2023-06-17 DIAGNOSIS — Z79899 Other long term (current) drug therapy: Secondary | ICD-10-CM | POA: Diagnosis not present

## 2023-06-17 DIAGNOSIS — F17210 Nicotine dependence, cigarettes, uncomplicated: Secondary | ICD-10-CM | POA: Diagnosis present

## 2023-06-17 DIAGNOSIS — F1290 Cannabis use, unspecified, uncomplicated: Secondary | ICD-10-CM | POA: Insufficient documentation

## 2023-06-17 DIAGNOSIS — F1521 Other stimulant dependence, in remission: Secondary | ICD-10-CM | POA: Insufficient documentation

## 2023-06-17 DIAGNOSIS — Z91148 Patient's other noncompliance with medication regimen for other reason: Secondary | ICD-10-CM | POA: Diagnosis not present

## 2023-06-17 DIAGNOSIS — Z818 Family history of other mental and behavioral disorders: Secondary | ICD-10-CM | POA: Diagnosis not present

## 2023-06-17 DIAGNOSIS — Z820 Family history of epilepsy and other diseases of the nervous system: Secondary | ICD-10-CM

## 2023-06-17 DIAGNOSIS — F1020 Alcohol dependence, uncomplicated: Secondary | ICD-10-CM | POA: Insufficient documentation

## 2023-06-17 DIAGNOSIS — F419 Anxiety disorder, unspecified: Secondary | ICD-10-CM | POA: Diagnosis not present

## 2023-06-17 DIAGNOSIS — F411 Generalized anxiety disorder: Secondary | ICD-10-CM | POA: Diagnosis present

## 2023-06-17 DIAGNOSIS — F1421 Cocaine dependence, in remission: Secondary | ICD-10-CM | POA: Insufficient documentation

## 2023-06-17 DIAGNOSIS — G4700 Insomnia, unspecified: Secondary | ICD-10-CM | POA: Diagnosis present

## 2023-06-17 DIAGNOSIS — F332 Major depressive disorder, recurrent severe without psychotic features: Secondary | ICD-10-CM | POA: Diagnosis present

## 2023-06-17 DIAGNOSIS — F603 Borderline personality disorder: Secondary | ICD-10-CM | POA: Diagnosis present

## 2023-06-17 DIAGNOSIS — F3160 Bipolar disorder, current episode mixed, unspecified: Secondary | ICD-10-CM | POA: Diagnosis not present

## 2023-06-17 DIAGNOSIS — F4310 Post-traumatic stress disorder, unspecified: Secondary | ICD-10-CM | POA: Diagnosis not present

## 2023-06-17 MED ORDER — NICOTINE 14 MG/24HR TD PT24
14.0000 mg | MEDICATED_PATCH | Freq: Every day | TRANSDERMAL | Status: DC
  Administered 2023-06-18 – 2023-06-28 (×11): 14 mg via TRANSDERMAL
  Filled 2023-06-17 (×12): qty 1

## 2023-06-17 MED ORDER — LORAZEPAM 1 MG PO TABS
1.0000 mg | ORAL_TABLET | Freq: Four times a day (QID) | ORAL | Status: AC | PRN
  Administered 2023-06-18 – 2023-06-20 (×3): 1 mg via ORAL
  Filled 2023-06-17 (×4): qty 1

## 2023-06-17 MED ORDER — LORAZEPAM 1 MG PO TABS
1.0000 mg | ORAL_TABLET | Freq: Three times a day (TID) | ORAL | Status: AC
  Administered 2023-06-18 – 2023-06-19 (×3): 1 mg via ORAL
  Filled 2023-06-17 (×3): qty 1

## 2023-06-17 MED ORDER — QUETIAPINE FUMARATE 50 MG PO TABS
50.0000 mg | ORAL_TABLET | Freq: Two times a day (BID) | ORAL | Status: DC | PRN
  Administered 2023-06-17: 50 mg via ORAL
  Filled 2023-06-17: qty 1

## 2023-06-17 MED ORDER — THIAMINE MONONITRATE 100 MG PO TABS
100.0000 mg | ORAL_TABLET | Freq: Every day | ORAL | Status: DC
Start: 2023-06-18 — End: 2023-06-17

## 2023-06-17 MED ORDER — ADULT MULTIVITAMIN W/MINERALS CH
1.0000 | ORAL_TABLET | Freq: Every day | ORAL | Status: DC
  Administered 2023-06-17: 1 via ORAL
  Filled 2023-06-17: qty 1

## 2023-06-17 MED ORDER — HALOPERIDOL 5 MG PO TABS
5.0000 mg | ORAL_TABLET | Freq: Three times a day (TID) | ORAL | Status: DC | PRN
  Administered 2023-06-22 – 2023-06-27 (×3): 5 mg via ORAL
  Filled 2023-06-17 (×3): qty 1

## 2023-06-17 MED ORDER — QUETIAPINE FUMARATE 100 MG PO TABS
100.0000 mg | ORAL_TABLET | Freq: Once | ORAL | Status: AC
  Administered 2023-06-17: 100 mg via ORAL
  Filled 2023-06-17 (×2): qty 1

## 2023-06-17 MED ORDER — MAGNESIUM HYDROXIDE 400 MG/5ML PO SUSP
30.0000 mL | Freq: Every day | ORAL | Status: DC | PRN
  Administered 2023-06-26 – 2023-06-28 (×2): 30 mL via ORAL
  Filled 2023-06-17 (×2): qty 30

## 2023-06-17 MED ORDER — LORAZEPAM 1 MG PO TABS: 1.0000 mg | ORAL_TABLET | Freq: Every day | ORAL | Status: DC

## 2023-06-17 MED ORDER — LORAZEPAM 1 MG PO TABS: 1.0000 mg | ORAL_TABLET | Freq: Two times a day (BID) | ORAL | Status: DC

## 2023-06-17 MED ORDER — LOPERAMIDE HCL 2 MG PO CAPS: 2.0000 mg | ORAL_CAPSULE | ORAL | Status: DC | PRN

## 2023-06-17 MED ORDER — QUETIAPINE FUMARATE 25 MG PO TABS: 25.0000 mg | ORAL_TABLET | Freq: Two times a day (BID) | ORAL | Status: DC | PRN

## 2023-06-17 MED ORDER — GABAPENTIN 300 MG PO CAPS
300.0000 mg | ORAL_CAPSULE | Freq: Three times a day (TID) | ORAL | Status: DC
  Administered 2023-06-17: 300 mg via ORAL
  Filled 2023-06-17: qty 1

## 2023-06-17 MED ORDER — HYDROXYZINE HCL 25 MG PO TABS
25.0000 mg | ORAL_TABLET | Freq: Four times a day (QID) | ORAL | Status: DC | PRN
  Administered 2023-06-17: 25 mg via ORAL
  Filled 2023-06-17: qty 1

## 2023-06-17 MED ORDER — HYDROXYZINE HCL 25 MG PO TABS
25.0000 mg | ORAL_TABLET | Freq: Three times a day (TID) | ORAL | Status: DC | PRN
  Administered 2023-06-18: 25 mg via ORAL
  Filled 2023-06-17 (×2): qty 1

## 2023-06-17 MED ORDER — TRAZODONE HCL 50 MG PO TABS
50.0000 mg | ORAL_TABLET | Freq: Every evening | ORAL | Status: DC | PRN
  Administered 2023-06-17 – 2023-06-21 (×5): 50 mg via ORAL
  Filled 2023-06-17 (×5): qty 1

## 2023-06-17 MED ORDER — HALOPERIDOL LACTATE 5 MG/ML IJ SOLN: 10.0000 mg | Freq: Three times a day (TID) | INTRAMUSCULAR | Status: DC | PRN

## 2023-06-17 MED ORDER — THIAMINE HCL 100 MG/ML IJ SOLN
100.0000 mg | Freq: Once | INTRAMUSCULAR | Status: AC
  Administered 2023-06-17: 100 mg via INTRAMUSCULAR
  Filled 2023-06-17: qty 2

## 2023-06-17 MED ORDER — LORAZEPAM 2 MG/ML IJ SOLN: 2.0000 mg | Freq: Three times a day (TID) | INTRAMUSCULAR | Status: DC | PRN

## 2023-06-17 MED ORDER — QUETIAPINE FUMARATE 50 MG PO TABS: 50.0000 mg | ORAL_TABLET | Freq: Two times a day (BID) | ORAL | Status: DC | PRN

## 2023-06-17 MED ORDER — GABAPENTIN 300 MG PO CAPS
300.0000 mg | ORAL_CAPSULE | Freq: Three times a day (TID) | ORAL | Status: DC
  Administered 2023-06-18 – 2023-06-28 (×31): 300 mg via ORAL
  Filled 2023-06-17 (×34): qty 1

## 2023-06-17 MED ORDER — LORAZEPAM 1 MG PO TABS: 1.0000 mg | ORAL_TABLET | Freq: Four times a day (QID) | ORAL | Status: DC | PRN

## 2023-06-17 MED ORDER — LORAZEPAM 1 MG PO TABS
1.0000 mg | ORAL_TABLET | Freq: Three times a day (TID) | ORAL | Status: DC
Start: 2023-06-18 — End: 2023-06-17

## 2023-06-17 MED ORDER — DIPHENHYDRAMINE HCL 50 MG/ML IJ SOLN: 50.0000 mg | Freq: Three times a day (TID) | INTRAMUSCULAR | Status: DC | PRN

## 2023-06-17 MED ORDER — LORAZEPAM 1 MG PO TABS
1.0000 mg | ORAL_TABLET | Freq: Four times a day (QID) | ORAL | Status: AC
  Administered 2023-06-17 – 2023-06-18 (×2): 1 mg via ORAL
  Filled 2023-06-17: qty 1

## 2023-06-17 MED ORDER — LORAZEPAM 1 MG PO TABS
1.0000 mg | ORAL_TABLET | Freq: Two times a day (BID) | ORAL | Status: AC
  Administered 2023-06-19 – 2023-06-20 (×2): 1 mg via ORAL
  Filled 2023-06-17 (×2): qty 1

## 2023-06-17 MED ORDER — LORAZEPAM 1 MG PO TABS
1.0000 mg | ORAL_TABLET | Freq: Every day | ORAL | Status: AC
  Administered 2023-06-21: 1 mg via ORAL
  Filled 2023-06-17: qty 1

## 2023-06-17 MED ORDER — ACETAMINOPHEN 325 MG PO TABS
650.0000 mg | ORAL_TABLET | Freq: Four times a day (QID) | ORAL | Status: DC | PRN
  Administered 2023-06-17 – 2023-06-26 (×6): 650 mg via ORAL
  Filled 2023-06-17 (×6): qty 2

## 2023-06-17 MED ORDER — VITAMIN B-1 100 MG PO TABS
100.0000 mg | ORAL_TABLET | Freq: Every day | ORAL | Status: DC
  Administered 2023-06-18 – 2023-06-28 (×11): 100 mg via ORAL
  Filled 2023-06-17 (×12): qty 1

## 2023-06-17 MED ORDER — HALOPERIDOL LACTATE 5 MG/ML IJ SOLN: 5.0000 mg | Freq: Three times a day (TID) | INTRAMUSCULAR | Status: DC | PRN

## 2023-06-17 MED ORDER — ALUM & MAG HYDROXIDE-SIMETH 200-200-20 MG/5ML PO SUSP
30.0000 mL | ORAL | Status: DC | PRN
  Administered 2023-06-23: 30 mL via ORAL
  Filled 2023-06-17: qty 30

## 2023-06-17 MED ORDER — DIPHENHYDRAMINE HCL 25 MG PO CAPS
50.0000 mg | ORAL_CAPSULE | Freq: Three times a day (TID) | ORAL | Status: DC | PRN
  Administered 2023-06-22 – 2023-06-27 (×3): 50 mg via ORAL
  Filled 2023-06-17 (×3): qty 2

## 2023-06-17 MED ORDER — QUETIAPINE FUMARATE 100 MG PO TABS: 100.0000 mg | ORAL_TABLET | Freq: Every day | ORAL | Status: DC

## 2023-06-17 MED ORDER — ONDANSETRON 4 MG PO TBDP
4.0000 mg | ORAL_TABLET | Freq: Four times a day (QID) | ORAL | Status: DC | PRN
  Administered 2023-06-17: 4 mg via ORAL
  Filled 2023-06-17: qty 1

## 2023-06-17 MED ORDER — LORAZEPAM 1 MG PO TABS
1.0000 mg | ORAL_TABLET | Freq: Four times a day (QID) | ORAL | Status: DC
  Administered 2023-06-17 (×2): 1 mg via ORAL
  Filled 2023-06-17 (×2): qty 1

## 2023-06-17 MED ORDER — ADULT MULTIVITAMIN W/MINERALS CH
1.0000 | ORAL_TABLET | Freq: Every day | ORAL | Status: DC
  Administered 2023-06-18 – 2023-06-28 (×11): 1 via ORAL
  Filled 2023-06-17 (×13): qty 1

## 2023-06-17 MED ORDER — DIPHENHYDRAMINE HCL 25 MG PO CAPS: 25.0000 mg | ORAL_CAPSULE | Freq: Four times a day (QID) | ORAL | Status: DC | PRN

## 2023-06-17 NOTE — ED Notes (Signed)
 Pt was provided dinner.
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
## 2023-06-17 NOTE — Group Note (Signed)
 Group Topic: Wellness  Group Date: 06/17/2023 Start Time: 1000 End Time: 1030 Facilitators: Londell Moh, NT  Department: [facility]  Number of Participants: 10  Group Focus: check in, concentration, and daily focus Treatment Modality:  Psychoeducation Interventions utilized were patient education Purpose: enhance coping skills, express feelings, and increase insight  Name: Megan Lloyd Date of Birth: 14-Sep-1975  MR: [id number]    Level of Participation: Pt did not attend group. Patients Problems:  Patient Active Problem List   Diagnosis Date Noted   Bipolar affective disorder, current episode mixed (HCC) 06/16/2023   MDD (major depressive disorder) 06/16/2023   Bipolar affective disorder, current episode depressed (HCC) 11/20/2020   Generalized anxiety disorder 11/20/2020   Sleep disturbances 11/20/2020

## 2023-06-17 NOTE — ED Notes (Signed)
 Patient told MD that she was having continued thoughts of self harm and since patient had a serious attempt prior to arrival M.D. reached out to [facility] and she will be transferred to inpatient later this evening around 8pm.  She remains under IVC.  Patient initially very upset about this change however, after multiple discussions with her she is accepting of the plan.  Patient states she will not harm self here and if overwhelmed she will and has been seeking out staff.

## 2023-06-17 NOTE — Group Note (Signed)
 Group Topic: Wellness  Group Date: 06/17/2023 Start Time: 1145 End Time: 1230 Facilitators: Londell Moh, NT  Department: [facility]  Number of Participants: 10  Group Focus: Nutrition Treatment Modality:  Psychoeducation Interventions utilized were patient education Purpose: increase insight  Name: Megan Lloyd Date of Birth: 06-15-1975  MR: [id number]    Level of Participation: Pt did not attend group  Patients Problems:  Patient Active Problem List   Diagnosis Date Noted   Bipolar affective disorder, current episode mixed (HCC) 06/16/2023   MDD (major depressive disorder) 06/16/2023   Bipolar affective disorder, current episode depressed (HCC) 11/20/2020   Generalized anxiety disorder 11/20/2020   Sleep disturbances 11/20/2020

## 2023-06-17 NOTE — ED Notes (Signed)
 Patient is sleeping. Respirations equal and unlabored, skin warm and dry. No change in assessment or acuity. Routine safety checks conducted according to facility protocol. Will continue to monitor for safety.

## 2023-06-17 NOTE — Group Note (Signed)
 Group Topic: Feelings about Diagnosis  Group Date: 06/17/2023 Start Time: 1120 End Time: 1145 Facilitators: Jenean Lindau, RN  Department: [facility]  Number of Participants: 10  Group Focus: abuse issues, acceptance, affirmation, anger management, anxiety, chemical dependency education, and coping skills Treatment Modality:  Behavior Modification Therapy Interventions utilized were clarification, confrontation, exploration, and group exercise Purpose: enhance coping skills, explore maladaptive thinking, express feelings, express irrational fears, improve communication skills, increase insight, regain self-worth, reinforce self-care, and relapse prevention strategies  Name: Megan Lloyd Date of Birth: 1975-06-26  MR: [id number]    Level of Participation: did not attend Quality of Participation:  Interactions with others:  Mood/Affect:  Triggers (if applicable):  Cognition:  Progress:  Response:  Plan:   Patients Problems:  Patient Active Problem List   Diagnosis Date Noted   Bipolar affective disorder, current episode mixed (HCC) 06/16/2023   MDD (major depressive disorder) 06/16/2023   Bipolar affective disorder, current episode depressed (HCC) 11/20/2020   Generalized anxiety disorder 11/20/2020   Sleep disturbances 11/20/2020
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
## 2023-06-17 NOTE — Plan of Care (Signed)
   Problem: Education: Goal: Emotional status will improve Outcome: Progressing Goal: Mental status will improve Outcome: Progressing   Problem: Activity: Goal: Sleeping patterns will improve Outcome: Progressing   Problem: Safety: Goal: Periods of time without injury will increase Outcome: Progressing

## 2023-06-17 NOTE — ED Notes (Signed)
 Patient seen interacting with peers in the milieu area. No distress noted, no reports of SIHI, AVH, or anxiety at this time. Patient is wearing her personal clothing. Staff will continue to monitor safety and for changes in condition.

## 2023-06-17 NOTE — Discharge Instructions (Addendum)
 Patient is being transferred to [facility]

## 2023-06-17 NOTE — Tx Team (Signed)
 Initial Treatment Plan 06/17/2023 11:42 PM Megan Lloyd [id number]:[id number]    PATIENT STRESSORS: Financial difficulties   Loss of income   Marital or family conflict     PATIENT STRENGTHS: Capable of independent living  General fund of knowledge  Supportive family/friends    PATIENT IDENTIFIED PROBLEMS: "I need to deal with my thoughts."  "I need to work on getting back to [place]."                   DISCHARGE CRITERIA:  Adequate post-discharge living arrangements Improved stabilization in mood, thinking, and/or behavior Motivation to continue treatment in a less acute level of care Verbal commitment to aftercare and medication compliance  PRELIMINARY DISCHARGE PLAN: Outpatient therapy  PATIENT/FAMILY INVOLVEMENT: This treatment plan has been presented to and reviewed with the patient, Megan Lloyd, and/or family member.  The patient and family have been given the opportunity to ask questions and make suggestions.  Alethia Berthold, RN 06/17/2023, 11:42 PM

## 2023-06-18 ENCOUNTER — Encounter (HOSPITAL_COMMUNITY): Payer: Self-pay

## 2023-06-18 DIAGNOSIS — F332 Major depressive disorder, recurrent severe without psychotic features: Secondary | ICD-10-CM | POA: Diagnosis not present

## 2023-06-18 LAB — VITAMIN B12: Vitamin B-12: 500 pg/mL (ref 180–914)

## 2023-06-18 LAB — FOLATE: Folate: 11.4 ng/mL

## 2023-06-18 LAB — TSH: TSH: 1.082 u[IU]/mL (ref 0.350–4.500)

## 2023-06-18 LAB — VITAMIN D 25 HYDROXY (VIT D DEFICIENCY, FRACTURES): Vit D, 25-Hydroxy: 46.74 ng/mL (ref 30–100)

## 2023-06-18 MED ORDER — QUETIAPINE FUMARATE 100 MG PO TABS
100.0000 mg | ORAL_TABLET | Freq: Every day | ORAL | Status: DC
  Administered 2023-06-18 – 2023-06-21 (×4): 100 mg via ORAL
  Filled 2023-06-18 (×7): qty 1

## 2023-06-18 MED ORDER — NALTREXONE HCL 50 MG PO TABS
50.0000 mg | ORAL_TABLET | Freq: Every day | ORAL | Status: DC
  Administered 2023-06-19 – 2023-06-28 (×10): 50 mg via ORAL
  Filled 2023-06-18 (×11): qty 1

## 2023-06-18 MED ORDER — WHITE PETROLATUM EX OINT
TOPICAL_OINTMENT | CUTANEOUS | Status: AC
  Filled 2023-06-18: qty 5

## 2023-06-18 MED ORDER — CLONIDINE HCL 0.1 MG PO TABS
0.1000 mg | ORAL_TABLET | Freq: Once | ORAL | Status: DC
  Filled 2023-06-18: qty 1

## 2023-06-18 NOTE — Plan of Care (Signed)

## 2023-06-18 NOTE — BH IP Treatment Plan (Signed)
 Interdisciplinary Treatment and Diagnostic Plan Update  06/18/2023 Time of Session: 11:00 AM Ayushi Pla MRN: [id number]  Principal Diagnosis: MDD (major depressive disorder), recurrent severe, without psychosis (HCC)  Secondary Diagnoses: Principal Problem:   MDD (major depressive disorder), recurrent severe, without psychosis (HCC)   Current Medications:  Current Facility-Administered Medications  Medication Dose Route Frequency Provider Last Rate Last Admin   acetaminophen (TYLENOL) tablet 650 mg  650 mg Oral Q6H PRN Carrion-Carrero, Karle Starch, MD   650 mg at 06/18/23 0821   alum & mag hydroxide-simeth (MAALOX/MYLANTA) 200-200-20 MG/5ML suspension 30 mL  30 mL Oral Q4H PRN Carrion-Carrero, Margely, MD       diphenhydrAMINE (BENADRYL) capsule 25 mg  25 mg Oral Q6H PRN Carrion-Carrero, Margely, MD       haloperidol (HALDOL) tablet 5 mg  5 mg Oral TID PRN Carrion-Carrero, Margely, MD       And   diphenhydrAMINE (BENADRYL) capsule 50 mg  50 mg Oral TID PRN Carrion-Carrero, Margely, MD       haloperidol lactate (HALDOL) injection 5 mg  5 mg Intramuscular TID PRN Carrion-Carrero, Margely, MD       And   diphenhydrAMINE (BENADRYL) injection 50 mg  50 mg Intramuscular TID PRN Carrion-Carrero, Margely, MD       And   LORazepam (ATIVAN) injection 2 mg  2 mg Intramuscular TID PRN Carrion-Carrero, Margely, MD       haloperidol lactate (HALDOL) injection 10 mg  10 mg Intramuscular TID PRN Carrion-Carrero, Margely, MD       And   diphenhydrAMINE (BENADRYL) injection 50 mg  50 mg Intramuscular TID PRN Carrion-Carrero, Margely, MD       And   LORazepam (ATIVAN) injection 2 mg  2 mg Intramuscular TID PRN Carrion-Carrero, Margely, MD       gabapentin (NEURONTIN) capsule 300 mg  300 mg Oral TID Lorri Frederick, MD   300 mg at 06/18/23 1618   hydrOXYzine (ATARAX) tablet 25 mg  25 mg Oral TID PRN Carrion-Carrero, Karle Starch, MD       LORazepam (ATIVAN) tablet 1 mg  1 mg Oral TID Carrion-Carrero,  Margely, MD   1 mg at 06/18/23 1617   Followed by   Melene Muller ON 06/19/2023] LORazepam (ATIVAN) tablet 1 mg  1 mg Oral BID Carrion-Carrero, Margely, MD       Followed by   Melene Muller ON 06/21/2023] LORazepam (ATIVAN) tablet 1 mg  1 mg Oral Daily Carrion-Carrero, Margely, MD       LORazepam (ATIVAN) tablet 1 mg  1 mg Oral Q6H PRN Carrion-Carrero, Margely, MD       magnesium hydroxide (MILK OF MAGNESIA) suspension 30 mL  30 mL Oral Daily PRN Carrion-Carrero, Margely, MD       multivitamin with minerals tablet 1 tablet  1 tablet Oral Daily Carrion-Carrero, Margely, MD   1 tablet at 06/18/23 0820   [START ON 06/19/2023] naltrexone (DEPADE) tablet 50 mg  50 mg Oral Daily Nwoko, Agnes I, NP       nicotine (NICODERM CQ - dosed in mg/24 hours) patch 14 mg  14 mg Transdermal Daily Attiah, Nadir, MD   14 mg at 06/18/23 0820   QUEtiapine (SEROQUEL) tablet 100 mg  100 mg Oral QHS Nwoko, Agnes I, NP       thiamine (Vitamin B-1) tablet 100 mg  100 mg Oral Daily Carrion-Carrero, Margely, MD   100 mg at 06/18/23 0820   traZODone (DESYREL) tablet 50 mg  50 mg Oral QHS PRN Carrion-Carrero, Margely,  MD   50 mg at 06/17/23 2229   PTA Medications: Medications Prior to Admission  Medication Sig Dispense Refill Last Dose/Taking   QUEtiapine (SEROQUEL) 100 MG tablet Take 1 tablet (100 mg total) by mouth at bedtime.      QUEtiapine (SEROQUEL) 25 MG tablet Take 1-2 tablets (25-50 mg total) by mouth 2 (two) times daily as needed (anxiety).      QUEtiapine (SEROQUEL) 50 MG tablet Take 1 tablet (50 mg total) by mouth 2 (two) times daily as needed (anxiety).       Patient Stressors: Financial difficulties   Loss of income   Marital or family conflict    Patient Strengths: Capable of independent living  General fund of knowledge  Supportive family/friends   Treatment Modalities: Medication Management, Group therapy, Case management,  1 to 1 session with clinician, Psychoeducation, Recreational therapy.   Physician Treatment  Plan for Primary Diagnosis: MDD (major depressive disorder), recurrent severe, without psychosis (HCC) Long Term Goal(s): Improvement in symptoms so as ready for discharge   Short Term Goals: Ability to identify and develop effective coping behaviors will improve Ability to maintain clinical measurements within normal limits will improve Compliance with prescribed medications will improve Ability to identify triggers associated with substance abuse/mental health issues will improve Ability to identify changes in lifestyle to reduce recurrence of condition will improve Ability to verbalize feelings will improve Ability to disclose and discuss suicidal ideas Ability to demonstrate self-control will improve  Medication Management: Evaluate patient's response, side effects, and tolerance of medication regimen.  Therapeutic Interventions: 1 to 1 sessions, Unit Group sessions and Medication administration.  Evaluation of Outcomes: Not Progressing  Physician Treatment Plan for Secondary Diagnosis: Principal Problem:   MDD (major depressive disorder), recurrent severe, without psychosis (HCC)  Long Term Goal(s): Improvement in symptoms so as ready for discharge   Short Term Goals: Ability to identify and develop effective coping behaviors will improve Ability to maintain clinical measurements within normal limits will improve Compliance with prescribed medications will improve Ability to identify triggers associated with substance abuse/mental health issues will improve Ability to identify changes in lifestyle to reduce recurrence of condition will improve Ability to verbalize feelings will improve Ability to disclose and discuss suicidal ideas Ability to demonstrate self-control will improve     Medication Management: Evaluate patient's response, side effects, and tolerance of medication regimen.  Therapeutic Interventions: 1 to 1 sessions, Unit Group sessions and Medication  administration.  Evaluation of Outcomes: Not Progressing   RN Treatment Plan for Primary Diagnosis: MDD (major depressive disorder), recurrent severe, without psychosis (HCC) Long Term Goal(s): Knowledge of disease and therapeutic regimen to maintain health will improve  Short Term Goals: Ability to remain free from injury will improve, Ability to verbalize frustration and anger appropriately will improve, Ability to demonstrate self-control, Ability to participate in decision making will improve, Ability to verbalize feelings will improve, Ability to disclose and discuss suicidal ideas, Ability to identify and develop effective coping behaviors will improve, and Compliance with prescribed medications will improve  Medication Management: RN will administer medications as ordered by provider, will assess and evaluate patient's response and provide education to patient for prescribed medication. RN will report any adverse and/or side effects to prescribing provider.  Therapeutic Interventions: 1 on 1 counseling sessions, Psychoeducation, Medication administration, Evaluate responses to treatment, Monitor vital signs and CBGs as ordered, Perform/monitor CIWA, COWS, AIMS and Fall Risk screenings as ordered, Perform wound care treatments as ordered.  Evaluation of Outcomes: Not  Progressing   LCSW Treatment Plan for Primary Diagnosis: MDD (major depressive disorder), recurrent severe, without psychosis (HCC) Long Term Goal(s): Safe transition to appropriate next level of care at discharge, Engage patient in therapeutic group addressing interpersonal concerns.  Short Term Goals: Engage patient in aftercare planning with referrals and resources, Increase social support, Increase ability to appropriately verbalize feelings, Increase emotional regulation, Facilitate acceptance of mental health diagnosis and concerns, Facilitate patient progression through stages of change regarding substance use diagnoses  and concerns, Identify triggers associated with mental health/substance abuse issues, and Increase skills for wellness and recovery  Therapeutic Interventions: Assess for all discharge needs, 1 to 1 time with Social worker, Explore available resources and support systems, Assess for adequacy in community support network, Educate family and significant other(s) on suicide prevention, Complete Psychosocial Assessment, Interpersonal group therapy.  Evaluation of Outcomes: Not Progressing   Progress in Treatment: Attending groups: No. Participating in groups: No. Taking medication as prescribed: Yes. Toleration medication: Yes. Family/Significant other contact made: consents are pending Patient understands diagnosis: Yes. Discussing patient identified problems/goals with staff: Yes. Medical problems stabilized or resolved: Yes. Denies suicidal/homicidal ideation: Yes. Issues/concerns per patient self-inventory:  No  New problem(s) identified:  No  New Short Term/Long Term Goal(s):   medication stabilization, elimination of SI thoughts, development of comprehensive mental wellness plan.   Patient Goals:  "I came here because I didn't want to live.  I want to go home."  Patient said that she wants to go to her sister's home at discharge.  Her sister live in [place] and is a Engineer, civil (consulting).  Discharge Plan or Barriers:  Patient recently admitted. CSW will continue to follow and assess for appropriate referrals and possible discharge planning.    Reason for Continuation of Hospitalization: Anxiety Depression Medication stabilization Suicidal ideation  Estimated Length of Stay:  5 - 7 days  Last 3 [place] Suicide Severity Risk Score: Flowsheet Row [facility] from 06/16/2023 in [facility] ED from 06/15/2023 in [facility]  C-SSRS RISK CATEGORY High Risk Moderate Risk  Moderate Risk       Last [facility] 2/9 Scores:    06/17/2023    3:30 PM 06/15/2023    8:26 AM 04/27/2023   11:47 AM  Depression screen PHQ 2/9  Decreased Interest 2 3 3   Down, Depressed, Hopeless 2 3 3   PHQ - 2 Score 4 6 6   Altered sleeping 1 3 3   Tired, decreased energy 2 3 3   Change in appetite 2 3 3   Feeling bad or failure about yourself  2 3 3   Trouble concentrating 2 3 3   Moving slowly or fidgety/restless 2 0 0  Suicidal thoughts 2 3 --  PHQ-9 Score 17 24 21   Difficult doing work/chores Very difficult  Extremely dIfficult    Scribe for Treatment Team: Alla Feeling, LCSWA 06/18/2023 6:15 PM
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
## 2023-06-18 NOTE — Group Note (Signed)
 Recreation Therapy Group Note   Group Topic:Leisure Education  Group Date: 06/18/2023 Start Time: 0930 End Time: 1059 Facilitators: Aundrea Higginbotham-McCall, LRT,CTRS Location: [address] Dayroom   Group Topic: Leisure Education  Goal Area(s) Addresses:  Patient will identify positive leisure activities for use post discharge. Patient will identify at least one positive benefit of participation in leisure activities.  Patient will work effectively work with peers to complete activity.  Intervention: [employer], Music   Activity: Patients had to get in a circle and hit a beach ball to each like volleyball. LRT would time the patients to see how long they could keep the ball in play. It the ball were to come to a complete stop at any point, the time would be restarted. Patients could let the ball bounce or roll on the floor or chair as long as it didn't stop moving.  Education:  Leisure Scientist, physiological, Special educational needs teacher, Teamwork, Discharge Planning  Education Outcome: Acknowledges education/In group clarification offered/Needs additional education.    Affect/Mood: N/A   Participation Level: Did not attend    Clinical Observations/Individualized Feedback:      Plan: Continue to engage patient in RT group sessions 2-3x/week.   Hulon Ferron-McCall, LRT,CTRS  06/18/2023 12:23 PM
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
## 2023-06-18 NOTE — Progress Notes (Addendum)
 D: Patient is alert, very anxious, oriented, and cooperative. Patient endorses SI with no plan in the hospital. Denies HI and AVH. Patient reports she slept fair last night with sleeping medication. Patient reports her appetite as fair, energy level as low, and concentration as poor. Patient rates her depression 10/10, hopelessness 10/10, and anxiety 10/10. Patient reports headache and anxiety. Patient states her goal is "not to freak out on anyone". Patient blood pressure is elevated.   0800 CIWA 12, 1200 CIWA 11, 1800 CIWA 6   A: Scheduled medications administered per MD order. PRN tylenol administered. Support provided. Patient educated on safety on the unit and medications. Routine safety checks every 15 minutes. Patient stated understanding to tell nurse about any new physical symptoms. Patient understands to tell staff of any needs.     R: No adverse drug reactions noted. Patient remains safe at this time and will continue to monitor.    06/18/23 1000  Psych Admission Type (Psych Patients Only)  Admission Status Involuntary  Psychosocial Assessment  Patient Complaints Anxiety;Depression  Eye Contact Fair  Facial Expression Anxious  Affect Anxious  Speech Logical/coherent  Interaction Assertive  Motor Activity Restless  Appearance/Hygiene Unremarkable  Behavior Characteristics Cooperative  Mood Depressed;Anxious  Thought Process  Coherency Concrete thinking  Content Blaming others;Blaming self  Delusions None reported or observed  Perception WDL  Hallucination None reported or observed  Judgment Poor  Confusion None  Danger to Self  Current suicidal ideation? Passive  Description of Suicide Plan no plan in the hospital  Self-Injurious Behavior Some self-injurious ideation observed or expressed.  No lethal plan expressed   Agreement Not to Harm Self Yes  Description of Agreement verbal  Danger to Others  Danger to Others None reported or observed
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
## 2023-06-19 DIAGNOSIS — F332 Major depressive disorder, recurrent severe without psychotic features: Secondary | ICD-10-CM | POA: Diagnosis not present

## 2023-06-19 LAB — RPR: RPR Ser Ql: NONREACTIVE

## 2023-06-19 MED ORDER — VENLAFAXINE HCL ER 37.5 MG PO CP24
37.5000 mg | ORAL_CAPSULE | Freq: Every day | ORAL | Status: DC
  Administered 2023-06-19: 37.5 mg via ORAL
  Filled 2023-06-19 (×2): qty 1

## 2023-06-19 MED ORDER — ONDANSETRON 4 MG PO TBDP
4.0000 mg | ORAL_TABLET | Freq: Four times a day (QID) | ORAL | Status: AC | PRN
  Administered 2023-06-19 – 2023-06-20 (×2): 4 mg via ORAL
  Filled 2023-06-19 (×3): qty 1

## 2023-06-19 MED ORDER — LOPERAMIDE HCL 2 MG PO CAPS: 2.0000 mg | ORAL_CAPSULE | ORAL | Status: AC | PRN

## 2023-06-19 NOTE — BHH Counselor (Signed)
 DISCHARGE PLANNING:  CSW spoke with the patient and her daughter, Megan Lloyd. [name] confirmed that her mother has a flight scheduled for March 16th. The daughter is committed to ensuring her mother makes that flight to her aunt, and that the patient does have access to all her belonging and IDs. [name] also confirmed that the patient does not have a home to return to and cannot stay with her due to safety concerns.  CSW proposed discharging the patient to a hotel until her flight, but the daughter stated she would discuss the cost with her brother, although she is unsure if it will be possible. The daughter reported that her mother has called over 100 times today and that she is not currently answering the phone for her.  The daughter is willing to collaborate with staff to support her mother and is open to speaking with her once a day, when she is able to stabilize. The daughter clarified that she is not cutting her mother out of her life; she is simply requesting space and boundaries in order to take care of her own family. She has been caring for her mother since she was 65 years old and believes the patient should be with her half-sister in [place].  CSW spoke directly with the patient to relay the conversation and inform her about how she has access to her ID. CSW also asked the patient to stop calling her daughter for the time being, encouraging her to focus on her stabilization. The patient was not pleased but agreed to the request. CSW then relayed this information to the patient's nurse.  CSW spoke with the patient's sister in [place], with whom the patient is scheduled to meet on March 16th via a flight from [place]. The sister is supportive and is communicating with the patient's children to come up with a plan. CSW provided context that at [facility], we do not arrange housing for individuals, and often, we have to discharge patients to homeless shelters. The patient's sister stated that this  will absolutely not happen and assured that she will figure out a short-term solution, with the long-term plan being for the patient to live with her.   Aviyon Hocevar LCSWA

## 2023-06-19 NOTE — BHH Group Notes (Signed)
 BHH Group Notes:  (Nursing/MHT/Case Management/Adjunct)  Date:  06/19/2023  Time:  2000  Type of Therapy:   Wrap up group  Participation Level:  Active  Participation Quality:  Appropriate, Attentive, Sharing, and Supportive  Affect:  Appropriate  Cognitive:  Alert  Insight:  Improving  Engagement in Group:  Engaged  Modes of Intervention:  Clarification, Education, and Support  Summary of Progress/Problems: Positive thinking and positive change were discussed.   Marcille Buffy 06/19/2023, 9:21 PM

## 2023-06-19 NOTE — BHH Group Notes (Signed)
 Pt did not attend group activity.
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
## 2023-06-19 NOTE — Group Note (Signed)
 Type of Therapy and Topic:  Group Therapy - Safety  Participation Level:  Did Not Attend   Description of Group This process group involved patients discussing the situations or people in their lives that frequently make them safe or unsafe.  Anxiety was a common factor among all group participants and many of them described home situations that keep them on edge and not able to feel completely safe.  Three questions were addressed during the group:  (1) What makes you feel safe (or unsafe)?  (2) Do you feel safe with yourself and why?  (3) If you don't feel safe, what can you do?  A lengthy discussion ensued in which group members empathized with each other, gave suggestions to one another, and expressed their feelings freely.  Therapeutic Goals Patient will describe what makes them feel safe or unsafe in their everyday lives. Patient will think about and discuss whether they feel safe with themselves and what reasons might contribute to feeling safe or unsafe. Patients will participate in planning for what can be done to help themselves feel safer.   Summary of Patient Progress:  NA   Therapeutic Modalities Cognitive Behavioral Therapy  Megan Lloyd O Latash Nouri, LCSWA 06/19/2023  4:29 PM

## 2023-06-19 NOTE — Plan of Care (Signed)
  Problem: Education: Goal: Emotional status will improve Outcome: Progressing Goal: Verbalization of understanding the information provided will improve Outcome: Progressing   Problem: Activity: Goal: Interest or engagement in activities will improve Outcome: Progressing Goal: Sleeping patterns will improve Outcome: Progressing   Problem: Coping: Goal: Ability to verbalize frustrations and anger appropriately will improve Outcome: Progressing

## 2023-06-19 NOTE — Progress Notes (Signed)
 Pt reports better day, observed in dayroom laughing and interacting with peers.  Pt states she almost skipped wrap up group but decided to attend and states that she is very glad that she did.  Pt complimentary to MHT that performed group, Lillia Abed.  Pt states she feels so much better than she did yesterday.  Pt does report another "seizure" today that she did not report.  Pt states that she feels this is from alcohol withdrawal, states "I was drinking heavy every day".  Pt given Ativan due to seizure reported although CIWA is scoring much better.  Will continue to monitor.     06/19/23 2127  Psych Admission Type (Psych Patients Only)  Admission Status Involuntary  Psychosocial Assessment  Patient Complaints Anxiety  Eye Contact Fair  Facial Expression Animated  Affect Appropriate to circumstance  Speech Logical/coherent  Interaction Assertive  Motor Activity Other (Comment) (WDL)  Appearance/Hygiene Unremarkable  Behavior Characteristics Appropriate to situation  Mood Pleasant  Thought Process  Coherency WDL  Content WDL  Delusions None reported or observed  Perception WDL  Hallucination None reported or observed  Judgment Poor  Confusion None  Danger to Self  Current suicidal ideation? Denies  Self-Injurious Behavior No self-injurious ideation or behavior indicators observed or expressed   Agreement Not to Harm Self Yes  Description of Agreement verbal  Danger to Others  Danger to Others None reported or observed

## 2023-06-19 NOTE — Plan of Care (Signed)
   Problem: Education: Goal: Knowledge of [facility] Education information/materials will improve Outcome: Progressing   Problem: Education: Goal: Verbalization of understanding the information provided will improve Outcome: Progressing

## 2023-06-19 NOTE — Progress Notes (Signed)
   06/19/23 0530  15 Minute Checks  Location Bedroom  Visual Appearance Calm  Behavior Sleeping  Sleep (Behavioral Health Patients Only)  Calculate sleep? (Click Yes once per 24 hr at 0600 safety check) Yes  Documented sleep last 24 hours 8.5

## 2023-06-19 NOTE — Progress Notes (Signed)
   06/19/23 0800  Psych Admission Type (Psych Patients Only)  Admission Status Involuntary  Psychosocial Assessment  Patient Complaints Anxiety;Crying spells;Depression;Substance abuse;Self-harm thoughts  Eye Contact Fair  Facial Expression Anxious  Affect Anxious;Preoccupied  Speech Logical/coherent  Interaction Assertive  Motor Activity Restless;Fidgety  Appearance/Hygiene Unremarkable  Behavior Characteristics Appropriate to situation  Mood Depressed;Anxious  Thought Process  Coherency Concrete thinking  Content Blaming others;Blaming self  Delusions None reported or observed  Perception WDL  Hallucination None reported or observed  Judgment Poor  Confusion None  Danger to Self  Current suicidal ideation? Denies  Self-Injurious Behavior No self-injurious ideation or behavior indicators observed or expressed   Agreement Not to Harm Self Yes  Description of Agreement verbal  Danger to Others  Danger to Others None reported or observed

## 2023-06-19 NOTE — [facility]HH Group Notes (Signed)
Pt did not attend goals/orientation group.  

## 2023-06-20 DIAGNOSIS — F332 Major depressive disorder, recurrent severe without psychotic features: Secondary | ICD-10-CM | POA: Diagnosis not present

## 2023-06-20 MED ORDER — PRAZOSIN HCL 1 MG PO CAPS
1.0000 mg | ORAL_CAPSULE | Freq: Every day | ORAL | Status: DC
  Administered 2023-06-20: 1 mg via ORAL
  Filled 2023-06-20 (×3): qty 1

## 2023-06-20 MED ORDER — VENLAFAXINE HCL ER 37.5 MG PO CP24
37.5000 mg | ORAL_CAPSULE | Freq: Every day | ORAL | Status: DC
  Administered 2023-06-20: 37.5 mg via ORAL
  Filled 2023-06-20 (×2): qty 1

## 2023-06-20 MED ORDER — VENLAFAXINE HCL ER 75 MG PO CP24
75.0000 mg | ORAL_CAPSULE | Freq: Every day | ORAL | Status: DC
  Administered 2023-06-21: 75 mg via ORAL
  Filled 2023-06-20 (×3): qty 1

## 2023-06-20 NOTE — Progress Notes (Signed)
 Pt feeling better since receiving medications, states she feels that she is still suffering from withdrawal.  Will continue to monitor.    06/20/23 2134  Psych Admission Type (Psych Patients Only)  Admission Status Involuntary  Psychosocial Assessment  Patient Complaints Anxiety;Substance abuse  Eye Contact Fair  Facial Expression Animated  Affect Appropriate to circumstance  Speech Logical/coherent  Interaction Assertive  Motor Activity Other (Comment) (WDL)  Appearance/Hygiene Unremarkable  Behavior Characteristics Appropriate to situation  Mood Anxious;Pleasant  Thought Process  Coherency WDL  Content WDL  Delusions None reported or observed  Perception WDL  Hallucination None reported or observed  Judgment Poor  Confusion None  Danger to Self  Current suicidal ideation? Denies  Self-Injurious Behavior No self-injurious ideation or behavior indicators observed or expressed   Agreement Not to Harm Self Yes  Description of Agreement verbal  Danger to Others  Danger to Others None reported or observed

## 2023-06-20 NOTE — Plan of Care (Signed)
 Nurse discussed anxiety, depression and coping skills with patient.

## 2023-06-20 NOTE — Plan of Care (Signed)
  Problem: Education: Goal: Emotional status will improve Outcome: Progressing   Problem: Education: Goal: Verbalization of understanding the information provided will improve Outcome: Progressing   Problem: Activity: Goal: Interest or engagement in activities will improve Outcome: Progressing

## 2023-06-20 NOTE — Progress Notes (Signed)
 Pt came out of group, reported episode of vomiting and urinating on self.  Pt stated bad headache.  Pt tearful and apologetic.  Medication given as ordered.

## 2023-06-20 NOTE — Progress Notes (Signed)
   06/20/23 0530  15 Minute Checks  Location Bedroom  Visual Appearance Calm  Behavior Sleeping  Sleep (Behavioral Health Patients Only)  Calculate sleep? (Click Yes once per 24 hr at 0600 safety check) Yes  Documented sleep last 24 hours 8

## 2023-06-20 NOTE — Group Note (Signed)
 Date:  06/20/2023 Time:  3:24 PM  Group Topic/Focus:  Rediscovering Joy:   The focus of this group is to explore various ways to relieve stress in a positive manner.    Participation Level:  Active  Participation Quality:  Appropriate, Attentive, Sharing, and Supportive  Affect:  Appropriate  Cognitive:  Alert and Appropriate  Insight: Appropriate and Good  Engagement in Group:  Engaged and Supportive  Modes of Intervention:  Activity, Discussion, Exploration, Rapport Building, Socialization, and Support  Additional Comments:  Patients created a Life Collage  Shela Nevin 06/20/2023, 3:24 PM

## 2023-06-20 NOTE — B[facility] Group Notes (Signed)
 B[facility] Group Notes:  (Nursing/MHT/Case Management/Adjunct)  Date:  06/20/2023  Time:  2000  Type of Therapy:   Wrap up group  Participation Level:  Active  Participation Quality:  Appropriate, Attentive, Sharing, and Supportive  Affect:  Appropriate  Cognitive:  Alert  Insight:  Improving  Engagement in Group:  Engaged  Modes of Intervention:  Clarification, Education, and Socialization  Summary of Progress/Problems: Positive thinking and self-care were discussed.   Marcille Buffy 06/20/2023, 9:14 PM

## 2023-06-20 NOTE — Progress Notes (Signed)
 D:  Patient denied SI and HI, contracts for safety.  Denied A/V hallucinations.  Denied pain. A:  Medications administered per MD orders.  Emotional support and encouragement given patient. R:  Safety maintained with 15 minute checks. Patient stated she slept 6 hours last night.
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
## 2023-06-21 DIAGNOSIS — F332 Major depressive disorder, recurrent severe without psychotic features: Secondary | ICD-10-CM | POA: Diagnosis not present

## 2023-06-21 MED ORDER — ESCITALOPRAM OXALATE 5 MG PO TABS
5.0000 mg | ORAL_TABLET | Freq: Every day | ORAL | Status: AC
  Administered 2023-06-21: 5 mg via ORAL
  Filled 2023-06-21 (×2): qty 1

## 2023-06-21 MED ORDER — HYDROXYZINE HCL 50 MG PO TABS
50.0000 mg | ORAL_TABLET | Freq: Three times a day (TID) | ORAL | Status: DC | PRN
  Administered 2023-06-21 – 2023-06-26 (×7): 50 mg via ORAL
  Filled 2023-06-21 (×7): qty 1

## 2023-06-21 MED ORDER — PRAZOSIN HCL 2 MG PO CAPS
2.0000 mg | ORAL_CAPSULE | Freq: Every day | ORAL | Status: DC
  Administered 2023-06-21 – 2023-06-25 (×5): 2 mg via ORAL
  Filled 2023-06-21 (×2): qty 1
  Filled 2023-06-21: qty 2
  Filled 2023-06-21 (×3): qty 1
  Filled 2023-06-21 (×2): qty 2
  Filled 2023-06-21: qty 1

## 2023-06-21 MED ORDER — ESCITALOPRAM OXALATE 10 MG PO TABS
10.0000 mg | ORAL_TABLET | Freq: Every day | ORAL | Status: DC
Start: 2023-06-22 — End: 2023-06-23
  Filled 2023-06-21: qty 1

## 2023-06-21 MED ORDER — CLONIDINE HCL 0.1 MG PO TABS
0.1000 mg | ORAL_TABLET | Freq: Two times a day (BID) | ORAL | Status: DC | PRN
  Administered 2023-06-21 – 2023-06-27 (×5): 0.1 mg via ORAL
  Filled 2023-06-21 (×4): qty 1

## 2023-06-21 NOTE — Group Note (Signed)
 Recreation Therapy Group Note   Group Topic:Problem Solving  Group Date: 06/21/2023 Start Time: 0935 End Time: 1000 Facilitators: Maxamillian Tienda-McCall, LRT,CTRS Location: [address] Dayroom   Group Topic: Communication, Team Building, Problem Solving   Goal Area(s) Addresses:  Patient will effectively work with peer towards shared goal.  Patient will identify skills used to make activity successful.  Patient will identify how skills used during activity can be used to reach post d/c goals.    Intervention: STEM Activity   Activity: Straw Bridge. In teams of 3-5, patients were given 15 plastic drinking straws and an equal length of masking tape. Using the materials provided, patients were instructed to build a free standing bridge-like structure to suspend an everyday item (ex: puzzle box) off of the floor or table surface. All materials were required to be used by the team in their design. LRT facilitated post-activity discussion reviewing team process. Patients were encouraged to reflect how the skills used in this activity can be generalized to daily life post discharge.    Education: Pharmacist, community, Scientist, physiological, Discharge Planning    Education Outcome: Acknowledges education/In group clarification offered/Needs additional education.    Affect/Mood: Appropriate   Participation Level: Engaged   Participation Quality: Independent   Behavior: Appropriate   Speech/Thought Process: Focused   Insight: Good   Judgement: Good   Modes of Intervention: STEM Activity   Patient Response to Interventions:  Engaged   Education Outcome:  In group clarification offered    Clinical Observations/Individualized Feedback: Pt was bright and engaged during group session. Pt assisted peers were needed in order to successfully complete the activity.      Plan: Continue to engage patient in RT group sessions 2-3x/week.   Azai Gaffin-McCall, LRT,CTRS  06/21/2023 11:54 AM

## 2023-06-21 NOTE — Progress Notes (Signed)
   06/21/23 0531  15 Minute Checks  Location Bedroom  Visual Appearance Calm  Behavior Sleeping  Sleep (Behavioral Health Patients Only)  Calculate sleep? (Click Yes once per 24 hr at 0600 safety check) Yes  Documented sleep last 24 hours 8

## 2023-06-21 NOTE — Group Note (Signed)
 Date:  06/21/2023 Time:  6:13 PM  Group Topic/Focus:  Building Self Esteem:   The Focus of this group is helping patients become aware of the effects of self-esteem on their lives, the things they and others do that enhance or undermine their self-esteem, seeing the relationship between their level of self-esteem and the choices they make and learning ways to enhance self-esteem. Emotional Education:   The focus of this group is to discuss what feelings/emotions are, and how they are experienced.    Participation Level:  Did Not Attend  Participation Quality:   n/a  Affect:   n/a  Cognitive:   n/a  Insight: None  Engagement in Group:   n/a  Modes of Intervention:   n/a  Additional Comments:   Did not attend.   Edmund Hilda Lucilla Petrenko 06/21/2023, 6:13 PM

## 2023-06-21 NOTE — Group Note (Signed)
 Occupational Therapy Group Note  Group Topic: Sleep Hygiene  Group Date: 06/21/2023 Start Time: 1430 End Time: 1500 Facilitators: Ted Mcalpine, OT   Group Description: Group encouraged increased participation and engagement through topic focused on sleep hygiene. Patients reflected on the quality of sleep they typically receive and identified areas that need improvement. Group was given background information on sleep and sleep hygiene, including common sleep disorders. Group members also received information on how to improve one's sleep and introduced a sleep diary as a tool that can be utilized to track sleep quality over a length of time. Group session ended with patients identifying one or more strategies they could utilize or implement into their sleep routine in order to improve overall sleep quality.        Therapeutic Goal(s):  Identify one or more strategies to improve overall sleep hygiene  Identify one or more areas of sleep that are negatively impacted (sleep too much, too little, etc)     Participation Level: Engaged   Participation Quality: Independent   Behavior: Appropriate   Speech/Thought Process: Relevant   Affect/Mood: Appropriate   Insight: Fair   Judgement: Fair      Modes of Intervention: Education  Patient Response to Interventions:  Attentive   Plan: Continue to engage patient in OT groups 2 - 3x/week.  06/21/2023  Ted Mcalpine, OT  Kerrin Champagne, OT

## 2023-06-21 NOTE — Group Note (Signed)
 Date:  06/21/2023 Time:  9:32 PM  Group Topic/Focus:  Alcoholics Anonymous (AA) Meeting    Participation Level:  Did Not Attend  Participation Quality:   n/a  Affect:   n/a  Cognitive:   n/a  Insight: None  Engagement in Group:   n/a  Modes of Intervention:   n/a  Additional Comments:  Patient did not attend AA  Megan Lloyd 06/21/2023, 9:32 PM
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
## 2023-06-21 NOTE — Progress Notes (Signed)
 Checked patients BP on right arm First set was 136/104 Second set performed on left arm 156/104.  Annice Pih, RN will do a manual BP.

## 2023-06-21 NOTE — Plan of Care (Signed)
  Problem: Education: Goal: Mental status will improve Outcome: Progressing   Problem: Activity: Goal: Sleeping patterns will improve Outcome: Progressing   

## 2023-06-21 NOTE — Group Note (Signed)
 Date:  06/21/2023 Time:  4:29 PM  Group Topic/Focus:  Goals Group:   The focus of this group is to help patients establish daily goals to achieve during treatment and discuss how the patient can incorporate goal setting into their daily lives to aide in recovery. Orientation:   The focus of this group is to educate the patient on the purpose and policies of crisis stabilization and provide a format to answer questions about their admission.  The group details unit policies and expectations of patients while admitted.    Participation Level:  Active  Participation Quality:  Appropriate and Attentive  Affect:  Appropriate  Cognitive:  Appropriate  Insight: Appropriate  Engagement in Group:  Engaged  Modes of Intervention:  Discussion and Orientation  Additional Comments:   Pt actively participated in the Orientation and Goals group.   Edmund Hilda Echo [place] 06/21/2023, 4:29 PM

## 2023-06-21 NOTE — [facility] Group Notes (Signed)
 Topic: Spiritual care group on grief and loss facilitated by Chaplain Claudell Kyle, MDiv   Group Goal: Support / Education around grief and loss  Members engage in facilitated group support and psycho-social education.  Group Description: Following introductions and group rules, group members engaged in facilitated group dialog and support around topic of loss, with particular support around experiences of loss in their lives. Group Identified types of loss (relationships / self / things) and identified patterns, circumstances, and changes that precipitate losses. Reflected on thoughts / feelings around loss, normalized grief responses, and recognized variety in grief experience. Group noted Worden's four tasks of grief in discussion.  Group drew on Adlerian / Rogerian, narrative, MI,  Patient Progress: Megan Lloyd participated in about half of the group time. Participation was appropriate.  Megan Lloyd L. Megan Lloyd, M.Div [phone]

## 2023-06-21 NOTE — Progress Notes (Signed)
   06/21/23 0819  Psych Admission Type (Psych Patients Only)  Admission Status Involuntary  Psychosocial Assessment  Patient Complaints Anxiety;Crying spells;Depression;Sadness;Self-harm thoughts  Eye Contact Fair  Facial Expression Sad  Affect Sad  Speech Logical/coherent  Interaction Assertive  Motor Activity Other (Comment) (WDL)  Appearance/Hygiene Unremarkable  Behavior Characteristics Cooperative;Anxious  Mood Sad  Thought Process  Coherency WDL  Content WDL  Delusions None reported or observed  Perception WDL  Hallucination None reported or observed  Judgment Poor  Confusion None  Danger to Self  Current suicidal ideation? Passive  Self-Injurious Behavior Some self-injurious ideation observed or expressed.  No lethal plan expressed   Agreement Not to Harm Self Yes  Description of Agreement verbal  Danger to Others  Danger to Others None reported or observed

## 2023-06-22 ENCOUNTER — Ambulatory Visit (HOSPITAL_COMMUNITY): Payer: Medicaid Other

## 2023-06-22 DIAGNOSIS — F332 Major depressive disorder, recurrent severe without psychotic features: Secondary | ICD-10-CM | POA: Diagnosis not present

## 2023-06-22 MED ORDER — ARIPIPRAZOLE 5 MG PO TABS
5.0000 mg | ORAL_TABLET | Freq: Every day | ORAL | Status: DC
  Administered 2023-06-22 – 2023-06-28 (×7): 5 mg via ORAL
  Filled 2023-06-22 (×9): qty 1

## 2023-06-22 MED ORDER — TRAZODONE HCL 100 MG PO TABS
100.0000 mg | ORAL_TABLET | Freq: Every evening | ORAL | Status: DC | PRN
Start: 2023-06-22 — End: 2023-06-26
  Administered 2023-06-23 – 2023-06-25 (×3): 100 mg via ORAL
  Filled 2023-06-22 (×3): qty 1

## 2023-06-22 NOTE — [facility] Group Notes (Signed)
Patient did not attend the Wrap-up group. 

## 2023-06-22 NOTE — Progress Notes (Signed)
 Upon entering the patient's room for the shift change, the patient appeared tearful and agitated. As per the PRN mild agitation protocol, the patient was administered 5mg  of Haldol and 50mg  of Benadryl orally to address the increased agitation.

## 2023-06-22 NOTE — Progress Notes (Signed)
   06/21/23 2130  Psych Admission Type (Psych Patients Only)  Admission Status Involuntary  Psychosocial Assessment  Patient Complaints Anxiety;Depression (anxiety 6/10, depression 7/10)  Eye Contact Fair  Facial Expression Sad  Affect Sad  Speech Logical/coherent  Interaction Assertive  Motor Activity Other (Comment) (wnl)  Appearance/Hygiene Unremarkable  Behavior Characteristics Cooperative;Anxious  Mood Depressed  Thought Process  Coherency WDL  Content WDL  Delusions None reported or observed  Perception WDL  Hallucination None reported or observed  Judgment Poor  Confusion None  Danger to Self  Current suicidal ideation? Denies  Self-Injurious Behavior No self-injurious ideation or behavior indicators observed or expressed   Agreement Not to Harm Self Yes  Description of Agreement Verbal  Danger to Others  Danger to Others None reported or observed

## 2023-06-22 NOTE — Group Note (Signed)
 Recreation Therapy Group Note   Group Topic:Animal Assisted Therapy   Group Date: 06/22/2023 Start Time: 0946 End Time: 1030 Facilitators: Aveline Daus-McCall, LRT,CTRS Location: [address] Dayroom   Animal-Assisted Activity (AAA) Program Checklist/Progress Notes Patient Eligibility Criteria Checklist & Daily Group note for Rec Tx Intervention  AAA/T Program Assumption of Risk Form signed by Patient/ or Parent Legal Guardian Yes  Patient understands his/her participation is voluntary Yes  Education: Charity fundraiser, Appropriate Animal Interaction   Education Outcome: Acknowledges education.    Affect/Mood: N/A   Participation Level: Did not attend    Clinical Observations/Individualized Feedback:      Plan: Continue to engage patient in RT group sessions 2-3x/week.   Kalin Amrhein-McCall, LRT,CTRS  06/22/2023 12:59 PM

## 2023-06-22 NOTE — Progress Notes (Signed)
   06/22/23 2200  Psych Admission Type (Psych Patients Only)  Admission Status Involuntary  Psychosocial Assessment  Patient Complaints Anxiety  Eye Contact Fair  Facial Expression Anxious  Affect Irritable;Anxious  Speech Logical/coherent;Argumentative  Interaction Assertive  Motor Activity Slow  Appearance/Hygiene Unremarkable  Behavior Characteristics Agitated;Anxious  Mood Anxious;Irritable  Thought Process  Coherency WDL  Content Preoccupation  Delusions None reported or observed  Perception WDL  Hallucination None reported or observed  Judgment Impaired  Confusion None  Danger to Self  Current suicidal ideation? Denies  Description of Suicide Plan no plan  Self-Injurious Behavior No self-injurious ideation or behavior indicators observed or expressed   Agreement Not to Harm Self Yes  Description of Agreement verbal  Danger to Others  Danger to Others None reported or observed

## 2023-06-22 NOTE — Progress Notes (Addendum)
 Patient denies SI/HI/AVH this morning. Pt rates their depression a 10/10 and anxiety a 10/10. Pt reports that they slept "terrible" last night. Pt visibly agitated this morning reporting that she is unhappy with the staff last night. PRN agitation protocol administered per MAR. PRN Clonidine administered this morning for elevated BP, MD notified. Agitation protocol was effective, pt has been interactive on the unit and participating in groups throughout the day. Pt has been cooperative throughout the day. Patient has been compliant with medications and treatment plan. Q 15 minute safety checks are in place for patient's safety. Patient is currently safe on the unit.   06/22/23 0856  Psych Admission Type (Psych Patients Only)  Admission Status Involuntary  Psychosocial Assessment  Patient Complaints Anxiety;Depression;Sleep disturbance;Agitation  Eye Contact Fair  Facial Expression Worried;Anxious  Affect Irritable;Anxious  Speech Logical/coherent;Argumentative  Interaction Assertive  Motor Activity Slow  Appearance/Hygiene Unremarkable  Behavior Characteristics Agitated;Anxious  Mood Anxious;Irritable  Thought Process  Coherency WDL  Content Preoccupation  Delusions None reported or observed  Perception WDL  Hallucination None reported or observed  Judgment Impaired  Confusion None  Danger to Self  Current suicidal ideation? Denies  Description of Suicide Plan no plan  Self-Injurious Behavior No self-injurious ideation or behavior indicators observed or expressed   Agreement Not to Harm Self Yes  Description of Agreement verbal  Danger to Others  Danger to Others None reported or observed

## 2023-06-22 NOTE — Group Note (Addendum)
 LCSW Group Therapy Note   Group Date: 06/21/2023 Start Time: 1100 End Time: 1200  Participation:  patient was present  Type of Therapy:  Group Therapy  Topic:  "From "One Day" to "Today is Day One":  Begin Your Journey to Better Health and Mental Well-Being  Objective:  To educate participants on the importance of routine, sleep, diet, and movement for improving mental health and overall well-being. Encourage goal-setting for small, achievable changes.  3 Goals: Encourage participants to set one small, achievable goal related to sleep, diet, or movement for the coming week. Promote self-awareness by discussing the connection between physical and mental health. Support accountability by having participants share goals with the group.  Summary: Participants engaged in a discussion about lifestyle factors influencing mental health, including routines, sleep, nutrition, and movement. They set personal goals for the week to improve well-being and shared their goals for accountability.  Therapeutic Modalities: Cognitive Behavioral Therapy (CBT) for exploring the relationship between thoughts, feelings, and behaviors. Psychoeducation on lifestyle changes and their impact on mental health. Goal-setting to promote empowerment and motivation.   Alla Feeling, LCSWA 06/22/2023  12:27 PM
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
## 2023-06-22 NOTE — Group Note (Signed)
 Date:  06/22/2023 Time:  2:33 PM  Group Topic/Focus:  Goals Group:   The focus of this group is to help patients establish daily goals to achieve during treatment and discuss how the patient can incorporate goal setting into their daily lives to aide in recovery. Orientation:   The focus of this group is to educate the patient on the purpose and policies of crisis stabilization and provide a format to answer questions about their admission.  The group details unit policies and expectations of patients while admitted.     Participation Level:  Did Not Attend  Megan Lloyd 06/22/2023, 2:33 PM

## 2023-06-22 NOTE — Plan of Care (Signed)

## 2023-06-22 NOTE — Progress Notes (Signed)
 PRN mild agitation protocol administered PO this morning at 0808. Haldol 5mg  and Benadryl 50mg  administered PO to patient due to increased agitation. Patient fixated on feeling upset with how she was treated by staff last night. Patient visibly shaking at medication window reporting "I just want to slam my fist into the wall". Pt returned to room following medication administration.

## 2023-06-22 NOTE — Plan of Care (Signed)
   Problem: Education: Goal: Emotional status will improve Outcome: Progressing Goal: Mental status will improve Outcome: Progressing   Problem: Activity: Goal: Interest or engagement in activities will improve Outcome: Progressing Goal: Sleeping patterns will improve Outcome: Progressing

## 2023-06-23 ENCOUNTER — Encounter (HOSPITAL_COMMUNITY): Payer: Self-pay

## 2023-06-23 DIAGNOSIS — F332 Major depressive disorder, recurrent severe without psychotic features: Secondary | ICD-10-CM | POA: Diagnosis not present

## 2023-06-23 MED ORDER — PANTOPRAZOLE SODIUM 40 MG PO TBEC
40.0000 mg | DELAYED_RELEASE_TABLET | Freq: Every day | ORAL | Status: DC
  Administered 2023-06-23 – 2023-06-28 (×6): 40 mg via ORAL
  Filled 2023-06-23 (×9): qty 1

## 2023-06-23 MED ORDER — ESCITALOPRAM OXALATE 5 MG PO TABS
5.0000 mg | ORAL_TABLET | Freq: Every day | ORAL | Status: DC
  Administered 2023-06-23 – 2023-06-25 (×3): 5 mg via ORAL
  Filled 2023-06-23 (×6): qty 1

## 2023-06-23 MED ORDER — ONDANSETRON 4 MG PO TBDP
4.0000 mg | ORAL_TABLET | Freq: Three times a day (TID) | ORAL | Status: DC | PRN
  Administered 2023-06-23 – 2023-06-27 (×4): 4 mg via ORAL
  Filled 2023-06-23 (×4): qty 1

## 2023-06-23 NOTE — Progress Notes (Signed)
 Pt c/o of not feeling well, unable to verbalized symptoms. Patient states that she is nauseous but that the Zofran helped her, she wants to remain in bed for now and take AM meds later.    06/23/23 0815  Psych Admission Type (Psych Patients Only)  Admission Status Involuntary  Psychosocial Assessment  Patient Complaints Anxiety;Depression  Eye Contact Fair  Facial Expression Anxious  Affect Anxious  Speech Logical/coherent  Interaction Assertive  Motor Activity Slow  Appearance/Hygiene Unremarkable  Behavior Characteristics Anxious  Mood Anxious  Thought Process  Coherency WDL  Content Preoccupation  Delusions None reported or observed  Perception WDL  Hallucination None reported or observed  Judgment Impaired  Confusion None  Danger to Self  Current suicidal ideation? Denies  Agreement Not to Harm Self Yes  Description of Agreement verbal  Danger to Others  Danger to Others None reported or observed

## 2023-06-23 NOTE — [facility] Group Notes (Signed)
 [facility] Group Notes:  (Nursing/MHT/Case Management/Adjunct)  Date:  06/23/2023  Time:  8:39 PM  Type of Therapy:   NA group  Participation Level:  Active  Participation Quality:  Appropriate  Affect:  Appropriate  Cognitive:  Appropriate  Insight:  Appropriate  Engagement in Group:  Engaged  Modes of Intervention:  Education  Summary of Progress/Problems: Attended NA meeting.  Megan Lloyd 06/23/2023, 8:39 PM

## 2023-06-23 NOTE — BH IP Treatment Plan (Signed)
 Interdisciplinary Treatment and Diagnostic Plan Update  06/23/2023 Time of Session: 1200PM - UPDATE Megan Lloyd MRN: [id number]  Principal Diagnosis: MDD (major depressive disorder), recurrent severe, without psychosis (HCC)  Secondary Diagnoses: Principal Problem:   MDD (major depressive disorder), recurrent severe, without psychosis (HCC)   Current Medications:  Current Facility-Administered Medications  Medication Dose Route Frequency Provider Last Rate Last Admin   acetaminophen (TYLENOL) tablet 650 mg  650 mg Oral Q6H PRN Carrion-Carrero, Karle Starch, MD   650 mg at 06/21/23 2119   alum & mag hydroxide-simeth (MAALOX/MYLANTA) 200-200-20 MG/5ML suspension 30 mL  30 mL Oral Q4H PRN Carrion-Carrero, Karle Starch, MD   30 mL at 06/23/23 0532   ARIPiprazole (ABILIFY) tablet 5 mg  5 mg Oral Daily Starleen Blue, NP   5 mg at 06/23/23 1002   cloNIDine (CATAPRES) tablet 0.1 mg  0.1 mg Oral BID PRN Starleen Blue, NP   0.1 mg at 06/22/23 8119   diphenhydrAMINE (BENADRYL) capsule 25 mg  25 mg Oral Q6H PRN Carrion-Carrero, Karle Starch, MD       haloperidol (HALDOL) tablet 5 mg  5 mg Oral TID PRN Lorri Frederick, MD   5 mg at 06/22/23 2112   And   diphenhydrAMINE (BENADRYL) capsule 50 mg  50 mg Oral TID PRN Lorri Frederick, MD   50 mg at 06/22/23 2111   haloperidol lactate (HALDOL) injection 5 mg  5 mg Intramuscular TID PRN Carrion-Carrero, Karle Starch, MD       And   diphenhydrAMINE (BENADRYL) injection 50 mg  50 mg Intramuscular TID PRN Carrion-Carrero, Margely, MD       haloperidol lactate (HALDOL) injection 10 mg  10 mg Intramuscular TID PRN Carrion-Carrero, Margely, MD       And   diphenhydrAMINE (BENADRYL) injection 50 mg  50 mg Intramuscular TID PRN Carrion-Carrero, Margely, MD       escitalopram (LEXAPRO) tablet 5 mg  5 mg Oral Daily Nwoko, Agnes I, NP   5 mg at 06/23/23 1200   gabapentin (NEURONTIN) capsule 300 mg  300 mg Oral TID Carrion-Carrero, Karle Starch, MD   300 mg at 06/23/23  1151   hydrOXYzine (ATARAX) tablet 50 mg  50 mg Oral TID PRN Starleen Blue, NP   50 mg at 06/23/23 0532   magnesium hydroxide (MILK OF MAGNESIA) suspension 30 mL  30 mL Oral Daily PRN Carrion-Carrero, Karle Starch, MD       multivitamin with minerals tablet 1 tablet  1 tablet Oral Daily Carrion-Carrero, Margely, MD   1 tablet at 06/23/23 1003   naltrexone (DEPADE) tablet 50 mg  50 mg Oral Daily Nwoko, Nicole Kindred I, NP   50 mg at 06/23/23 1003   nicotine (NICODERM CQ - dosed in mg/24 hours) patch 14 mg  14 mg Transdermal Daily Attiah, Nadir, MD   14 mg at 06/23/23 1002   ondansetron (ZOFRAN-ODT) disintegrating tablet 4 mg  4 mg Oral Q8H PRN Onuoha, Chinwendu V, NP   4 mg at 06/23/23 0723   pantoprazole (PROTONIX) EC tablet 40 mg  40 mg Oral Daily Nwoko, Nicole Kindred I, NP   40 mg at 06/23/23 1200   prazosin (MINIPRESS) capsule 2 mg  2 mg Oral QHS Nkwenti, Doris, NP   2 mg at 06/22/23 2111   thiamine (Vitamin B-1) tablet 100 mg  100 mg Oral Daily Carrion-Carrero, Margely, MD   100 mg at 06/23/23 1002   traZODone (DESYREL) tablet 100 mg  100 mg Oral QHS PRN Starleen Blue, NP       PTA  Medications: Medications Prior to Admission  Medication Sig Dispense Refill Last Dose/Taking   QUEtiapine (SEROQUEL) 100 MG tablet Take 1 tablet (100 mg total) by mouth at bedtime.      QUEtiapine (SEROQUEL) 25 MG tablet Take 1-2 tablets (25-50 mg total) by mouth 2 (two) times daily as needed (anxiety).      QUEtiapine (SEROQUEL) 50 MG tablet Take 1 tablet (50 mg total) by mouth 2 (two) times daily as needed (anxiety).       Patient Stressors: Financial difficulties   Loss of income   Marital or family conflict    Patient Strengths: Capable of independent living  General fund of knowledge  Supportive family/friends   Treatment Modalities: Medication Management, Group therapy, Case management,  1 to 1 session with clinician, Psychoeducation, Recreational therapy.   Physician Treatment Plan for Primary Diagnosis: MDD (major  depressive disorder), recurrent severe, without psychosis (HCC) Long Term Goal(s): Improvement in symptoms so as ready for discharge   Short Term Goals: Ability to identify and develop effective coping behaviors will improve Ability to maintain clinical measurements within normal limits will improve Compliance with prescribed medications will improve Ability to identify triggers associated with substance abuse/mental health issues will improve Ability to identify changes in lifestyle to reduce recurrence of condition will improve Ability to verbalize feelings will improve Ability to disclose and discuss suicidal ideas Ability to demonstrate self-control will improve  Medication Management: Evaluate patient's response, side effects, and tolerance of medication regimen.  Therapeutic Interventions: 1 to 1 sessions, Unit Group sessions and Medication administration.  Evaluation of Outcomes: Progressing  Physician Treatment Plan for Secondary Diagnosis: Principal Problem:   MDD (major depressive disorder), recurrent severe, without psychosis (HCC)  Long Term Goal(s): Improvement in symptoms so as ready for discharge   Short Term Goals: Ability to identify and develop effective coping behaviors will improve Ability to maintain clinical measurements within normal limits will improve Compliance with prescribed medications will improve Ability to identify triggers associated with substance abuse/mental health issues will improve Ability to identify changes in lifestyle to reduce recurrence of condition will improve Ability to verbalize feelings will improve Ability to disclose and discuss suicidal ideas Ability to demonstrate self-control will improve     Medication Management: Evaluate patient's response, side effects, and tolerance of medication regimen.  Therapeutic Interventions: 1 to 1 sessions, Unit Group sessions and Medication administration.  Evaluation of Outcomes:  Progressing   RN Treatment Plan for Primary Diagnosis: MDD (major depressive disorder), recurrent severe, without psychosis (HCC) Long Term Goal(s): Knowledge of disease and therapeutic regimen to maintain health will improve  Short Term Goals: Ability to remain free from injury will improve, Ability to demonstrate self-control, Ability to participate in decision making will improve, Ability to verbalize feelings will improve, and Ability to identify and develop effective coping behaviors will improve  Medication Management: RN will administer medications as ordered by provider, will assess and evaluate patient's response and provide education to patient for prescribed medication. RN will report any adverse and/or side effects to prescribing provider.  Therapeutic Interventions: 1 on 1 counseling sessions, Psychoeducation, Medication administration, Evaluate responses to treatment, Monitor vital signs and CBGs as ordered, Perform/monitor CIWA, COWS, AIMS and Fall Risk screenings as ordered, Perform wound care treatments as ordered.  Evaluation of Outcomes: Progressing   LCSW Treatment Plan for Primary Diagnosis: MDD (major depressive disorder), recurrent severe, without psychosis (HCC) Long Term Goal(s): Safe transition to appropriate next level of care at discharge, Engage patient in therapeutic group  addressing interpersonal concerns.  Short Term Goals: Engage patient in aftercare planning with referrals and resources, Increase social support, Increase ability to appropriately verbalize feelings, Facilitate acceptance of mental health diagnosis and concerns, and Identify triggers associated with mental health/substance abuse issues  Therapeutic Interventions: Assess for all discharge needs, 1 to 1 time with Social worker, Explore available resources and support systems, Assess for adequacy in community support network, Educate family and significant other(s) on suicide prevention, Complete  Psychosocial Assessment, Interpersonal group therapy.  Evaluation of Outcomes: Progressing   Progress in Treatment: Attending groups: No. Participating in groups: No. Taking medication as prescribed: Yes. Toleration medication: Yes. Family/Significant other contact made: No, will contact: [name] (Daughter) and Rondel Baton (Sister) Patient understands diagnosis: Yes. Discussing patient identified problems/goals with staff: Yes. Medical problems stabilized or resolved: Yes. Denies suicidal/homicidal ideation: Yes. Issues/concerns per patient self-inventory:  No   New problem(s) identified:  No   New Short Term/Long Term Goal(s):    medication stabilization, elimination of SI thoughts, development of comprehensive mental wellness plan.    Patient Goals:  "I came here because I didn't want to live.  I want to go home."  Patient said that she wants to go to her sister's home at discharge.  Her sister live in [place] and is a Engineer, civil (consulting).   Discharge Plan or Barriers:  Patient recently admitted. CSW will continue to follow and assess for appropriate referrals and possible discharge planning.      Reason for Continuation of Hospitalization: Anxiety Depression Medication stabilization Suicidal ideation   Estimated Length of Stay:  2-3 days  Last 3 [place] Suicide Severity Risk Score: Flowsheet Row Ad[facility] (Current) [facility] from [facility]16/2023 in [facility] from 06/15/2023 in [facility]  C-SSRS RISK CATEGORY High Risk Moderate Risk Moderate Risk       Last [facility] 2/9 Scores:    06/17/2023    3:30 PM 06/15/2023    8:26 AM 04/27/2023   11:47 AM  Depression screen PHQ 2/9  Decreased Interest 2 3 3   Down, Depressed, Hopeless 2 3 3   PHQ - 2 Score 4 6 6   Altered sleeping 1 3 3   Tired, decreased energy 2 3 3   Change in appetite 2 3 3   Feeling bad or failure about  yourself  2 3 3   Trouble concentrating 2 3 3   Moving slowly or fidgety/restless 2 0 0  Suicidal thoughts 2 3 --  PHQ-9 Score 17 24 21   Difficult doing work/chores Very difficult  Extremely dIfficult    Scribe for Treatment Team: Kathi Der, Theresia Majors 06/23/2023 1:41 PM

## 2023-06-23 NOTE — Plan of Care (Signed)
   Problem: Education: Goal: Knowledge of [facility] Education information/materials will improve Outcome: Progressing Goal: Emotional status will improve Outcome: Progressing Goal: Mental status will improve Outcome: Progressing Goal: Verbalization of understanding the information provided will improve Outcome: Progressing

## 2023-06-23 NOTE — Progress Notes (Signed)
   06/23/23 2100  Psych Admission Type (Psych Patients Only)  Admission Status Involuntary  Psychosocial Assessment  Patient Complaints Anxiety  Eye Contact Fair  Facial Expression Animated  Affect Silly  Speech Logical/coherent  Interaction Assertive  Motor Activity Slow  Appearance/Hygiene Unremarkable  Behavior Characteristics Cooperative;Appropriate to situation  Mood Pleasant  Thought Process  Coherency WDL  Content WDL  Delusions None reported or observed  Perception WDL  Hallucination None reported or observed  Judgment Poor  Confusion None  Danger to Self  Current suicidal ideation? Denies  Description of Suicide Plan none  Self-Injurious Behavior No self-injurious ideation or behavior indicators observed or expressed   Agreement Not to Harm Self Yes  Description of Agreement verbal  Danger to Others  Danger to Others None reported or observed

## 2023-06-23 NOTE — Plan of Care (Signed)
   Problem: Education: Goal: Knowledge of [facility] Education information/materials will improve Outcome: Progressing   Problem: Activity: Goal: Interest or engagement in activities will improve Outcome: Progressing   Problem: Coping: Goal: Ability to verbalize frustrations and anger appropriately will improve Outcome: Progressing

## 2023-06-23 NOTE — Plan of Care (Signed)
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
## 2023-06-23 NOTE — Progress Notes (Signed)
   06/23/23 0530  15 Minute Checks  Location Bedroom  Visual Appearance Calm  Behavior Sleeping  Sleep (Behavioral Health Patients Only)  Calculate sleep? (Click Yes once per 24 hr at 0600 safety check) Yes  Documented sleep last 24 hours 5.75

## 2023-06-23 NOTE — Group Note (Signed)
 Recreation Therapy Group Note   Group Topic:Leisure Education  Group Date: 06/23/2023 Start Time: 0933 End Time: 1000 Facilitators: Cote Mayabb-McCall, LRT,CTRS Location: [address] Dayroom   Group Topic: Leisure Education  Goal Area(s) Addresses:  Patient will successfully identify positive leisure and recreation activities.  Patient will acknowledge benefits of participation in healthy leisure activities post discharge.  Patient will actively work with peers toward a shared goal.   Intervention: Competitive Group Game    Activity: Pictionary. In groups of 5-7, patients took turns trying to guess the picture being drawn on the board by their teammate. If the team guessed the correct answer, they won a point.  If the team guessed wrong, the other team got a chance to steal the point. After several rounds of game play, the team with the most points were declared winners. Post-activity discussion reviewed benefits of positive recreation outlets: reducing stress, improving coping mechanisms, increasing self-esteem, and building larger support systems.   Education:  Teacher, English as a foreign language, Leisure as Merchant navy officer, Programmer, applications, Building control surveyor   Education Outcome: Acknowledges education/In group clarification offered/Needs additional education   Affect/Mood: N/A   Participation Level: Did not attend    Clinical Observations/Individualized Feedback:     Plan: Continue to engage patient in RT group sessions 2-3x/week.   Holston Oyama-McCall, LRT,CTRS 06/23/2023 11:23 AM

## 2023-06-23 NOTE — Progress Notes (Incomplete)
 Patient has changed her mind about an SSRI and now wants to take escitalopram.  We will reinstate at 5 mg nightly.  We will keep other medications the same and evaluate her further.  Hopeful discharge by weekend if she maintains stability.

## 2023-06-24 DIAGNOSIS — F332 Major depressive disorder, recurrent severe without psychotic features: Secondary | ICD-10-CM | POA: Diagnosis not present

## 2023-06-24 NOTE — Plan of Care (Signed)
  Problem: Education: Goal: Knowledge of [place] [facility] Education information/materials will improve Outcome: Completed/Met Goal: Emotional status will improve Outcome: Progressing Goal: Mental status will improve Outcome: Progressing Goal: Verbalization of understanding the information provided will improve Outcome: Progressing

## 2023-06-24 NOTE — Group Note (Signed)
 LCSW Group Therapy Note   Group Date: 06/24/2023 Start Time: 1100 End Time: 1200  Participation: patient was present and actively participated in the discussion.    Type of Therapy:  Group Therapy  Title: "Healing Flames: Navigating Anger with Compassion"  Objective:  Foster self-awareness and promote compassion toward oneself and others when dealing with anger.  Goals:  Help participants understand the underlying emotions and needs fueling anger. Provide coping strategies for healthier emotional expression and anger management.  Summary: This session explored anger as a volcano--an explosion driven by deeper feelings and unmet needs. Participants learned to identify anger triggers and underlying emotions, then practiced coping strategies like deep breathing, physical activity, and journaling. The group discussed healthy ways to manage anger before it escalates, using both personal reflection and shared experiences.  Therapeutic Modalities: Cognitive Behavioral Therapy (CBT): Challenging thoughts that fuel anger. Mindfulness: Increasing awareness of emotions and sensations.   Alla Feeling, LCSWA 06/24/2023  12:30 PM

## 2023-06-24 NOTE — [facility] Group Notes (Signed)
 [facility] Group Notes:  (Nursing/MHT/Case Management/Adjunct)  Date:  06/24/2023  Time:  9:09 PM  Type of Therapy:   Wrap-up group  Participation Level:  Active  Participation Quality:  Appropriate  Affect:  Appropriate  Cognitive:  Appropriate  Insight:  Appropriate  Engagement in Group:  Engaged  Modes of Intervention:  Education  Summary of Progress/Problems: Pt goal attend groups, goal met. Rated her day 10/10.  Megan Lloyd 06/24/2023, 9:09 PM

## 2023-06-24 NOTE — Progress Notes (Signed)
   06/24/23 0900  Psych Admission Type (Psych Patients Only)  Admission Status Involuntary  Psychosocial Assessment  Patient Complaints Anxiety  Eye Contact Fair  Facial Expression Animated  Affect Anxious  Speech Logical/coherent  Interaction Assertive  Motor Activity Other (Comment) (WNL)  Appearance/Hygiene Unremarkable  Behavior Characteristics Cooperative  Mood Pleasant  Thought Process  Coherency WDL  Content WDL  Delusions None reported or observed  Perception WDL  Hallucination None reported or observed  Judgment Poor  Confusion None  Danger to Self  Current suicidal ideation? Denies  Agreement Not to Harm Self Yes  Description of Agreement verbal  Danger to Others  Danger to Others None reported or observed

## 2023-06-24 NOTE — Group Note (Signed)
 Date:  06/24/2023 Time:  9:11 AM  Group Topic/Focus:  Goals Group:   The focus of this group is to help patients establish daily goals to achieve during treatment and discuss how the patient can incorporate goal setting into their daily lives to aide in recovery. Orientation:   The focus of this group is to educate the patient on the purpose and policies of crisis stabilization and provide a format to answer questions about their admission.  The group details unit policies and expectations of patients while admitted.    Participation Level:  Did Not Attend  Participation Quality:   n/a  Affect:   n/a  Cognitive:   n/a  Insight: None  Engagement in Group:   n/a  Modes of Intervention:   n/a  Additional Comments:  Did not attend  Azalee Course 06/24/2023, 9:11 AM
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
## 2023-06-24 NOTE — Progress Notes (Incomplete)
 Patient is subjectively and objectively better today.  Her mood is stabilizing appropriately.  She looks forward to discharge over the weekend as she has an already booked vacation.  She is not expressing any rageful thoughts towards herself or towards others.  We will maintain her current regimen and get feedback from her daughter.

## 2023-06-25 DIAGNOSIS — F332 Major depressive disorder, recurrent severe without psychotic features: Secondary | ICD-10-CM | POA: Diagnosis not present

## 2023-06-25 MED ORDER — ESCITALOPRAM OXALATE 5 MG PO TABS
5.0000 mg | ORAL_TABLET | Freq: Every day | ORAL | Status: DC
  Filled 2023-06-25: qty 1

## 2023-06-25 NOTE — Progress Notes (Signed)
   06/24/23 2221  Psych Admission Type (Psych Patients Only)  Admission Status Involuntary  Psychosocial Assessment  Patient Complaints Anxiety  Eye Contact Fair  Facial Expression Animated  Affect Silly  Speech Logical/coherent  Interaction Assertive  Motor Activity Slow  Appearance/Hygiene Unremarkable  Behavior Characteristics Cooperative;Appropriate to situation  Mood Pleasant  Thought Process  Coherency WDL  Content WDL  Delusions None reported or observed  Perception WDL  Hallucination None reported or observed  Judgment Poor  Confusion None  Danger to Self  Current suicidal ideation? Denies  Description of Suicide Plan none  Self-Injurious Behavior No self-injurious ideation or behavior indicators observed or expressed   Agreement Not to Harm Self Yes  Description of Agreement verbal  Danger to Others  Danger to Others None reported or observed

## 2023-06-25 NOTE — Progress Notes (Signed)
   06/25/23 1640  Spiritual Encounters  Type of Visit Initial  Care provided to: Patient  Referral source Patient request  Spiritual Framework  Presenting Themes Impactful experiences and emotions;Courage hope and growth   While rounding on unit, MS. Megan Lloyd requested time for prayer and support.  Megan Lloyd debriefed with me around significant life events. She also shared around current condition and her goals for healing.  I provided compassionate presence and engaged in both active and reflective listening. I invited some ways to reframe theological themes that may be more supportive and affirming.I affirmed positive steps and goals she named. I offered prayer at Megan Lloyd's request.  Megan Lloyd L. Sophronia Simas, M.Div [phone]

## 2023-06-25 NOTE — [facility] Suicide Risk Assessment (Signed)
 [facility] INPATIENT:  Family/Significant Other Suicide Prevention Education  Suicide Prevention Education:  Education Completed; [name] - daughter [phone]), has been identified by the patient as the family member/significant other with whom the patient will be residing, and identified as the person(s) who will aid the patient in the event of a mental health crisis (suicidal ideations/suicide attempt).  With written consent from the patient, the family member/significant other has been provided the following suicide prevention education, prior to the and/or following the discharge of the patient.  Spoke with Pt's daughter who is very supportive. She last spoke with Pt on Wednesday, March 12 for about 10 minutes. Daughter has not visited Pt, stating "I've always been a trigger for my mother." She went on to share information about history of their relationship and based on reports her behavior as described on the unit is consistent with her baseline. In regards to discharge planning, daughter feels that an early AM discharge on the same day as booked flight would be in mother's best interest. If discharge before then, daughter would have to make hotel arrangements. If Pt is instead discharged the same day as her scheduled flight, daughter is able to assist her in retrieving her clothes, taking her to a laundromat if necessary and further safely hand off Pt to airport for flight to her sisters home.   Treatment team update during morning progressing. Anticipate discharge for pt on Monday. Daughter requesting early AM, to ensure ample time to prepare prior to flight leaving.   The suicide prevention education provided includes the following: Suicide risk factors Suicide prevention and interventions National Suicide Hotline telephone number [facility] assessment telephone number [facility] Emergency Assistance [address] [facility] and/or Residential Mobile Crisis Unit telephone  number  Request made of family/significant other to: Remove weapons (e.g., guns, rifles, knives), all items previously/currently identified as safety concern.   Remove drugs/medications (over-the-counter, prescriptions, illicit drugs), all items previously/currently identified as a safety concern.  The family member/significant other verbalizes understanding of the suicide prevention education information provided.  The family member/significant other agrees to remove the items of safety concern listed above.  Jacinta Shoe, LCSW 06/25/2023, 8:34 AM

## 2023-06-25 NOTE — Group Note (Signed)
 Date:  06/25/2023 Time:  1:02 PM  Group Topic/Focus:  Managing Feelings:   The focus of this group is to identify what feelings patients have difficulty handling and develop a plan to handle them in a healthier way upon discharge. Primary and Secondary Emotions:   The focus of this group is to discuss the difference between primary and secondary emotions.    Participation Level:  Did Not Attend  Participation Quality:   n/a  Affect:   n/a  Cognitive:   n/a  Insight: None  Engagement in Group:   n/a  Modes of Intervention:   n/a  Additional Comments:   Did not attend  Stark Bray 06/25/2023, 1:02 PM

## 2023-06-25 NOTE — Group Note (Signed)
 Recreation Therapy Group Note   Group Topic:Team Building  Group Date: 06/25/2023 Start Time: 0930 End Time: 1000 Facilitators: Yogi Arther-McCall, LRT,CTRS Location: [address] [employer] Therapy Notes  Group Topic: Communication, Team Building, Problem Solving  Goal Area(s) Addresses:  Patient will effectively work with peer towards shared goal.  Patient will identify skills used to make activity successful.  Patient will identify how skills used during activity can be applied to reach post d/c goals.   Intervention: STEM Activity- Glass blower/designer  Activity: Tallest [employer]. In teams of 5-6, patients were given 11 craft pipe cleaners. Using the materials provided, patients were instructed to compete again the opposing team(s) to build the tallest free-standing structure from floor level. The activity was timed; difficulty increased by Clinical research associate as Production designer, theatre/television/film continued.  Systematically resources were removed with additional directions for example, placing one arm behind their back, working in silence, and shape stipulations. LRT facilitated post-activity discussion reviewing team processes and necessary communication skills involved in completion. Patients were encouraged to reflect how the skills utilized, or not utilized, in this activity can be incorporated to positively impact support systems post discharge.  Education: Pharmacist, community, Scientist, physiological, Discharge Planning   Education Outcome: Acknowledges education/In group clarification offered/Needs additional education.    Affect/Mood: N/A   Participation Level: Did not attend    Clinical Observations/Individualized Feedback:      Plan: Continue to engage patient in RT group sessions 2-3x/week.   Megan Lloyd, LRT,CTRS 06/25/2023 10:59 AM

## 2023-06-25 NOTE — Progress Notes (Signed)
 Pt visible in the milieu.  Interacting appropriately with staff and peers.  Pt she had a very good day.  She was able to talk with the Chaplain and reports that was very helpful.  Pt rated depression 4 and anxiety 6 while stating this is an improvement for her.  Denied SI, HI and AVH.  No distress noted or concerns verbalized.  Safety checks continue for patient safety.

## 2023-06-25 NOTE — Group Note (Signed)
 Date:  06/25/2023 Time:  1:23 PM  Group Topic/Focus:  Managing Feelings:   The focus of this group is to identify what feelings patients have difficulty handling and develop a plan to handle them in a healthier way upon discharge.    Participation Level:  Did Not Attend  Participation Quality:   n/a  Affect:   n/a  Cognitive:   n/a  Insight: None  Engagement in Group:   n/a  Modes of Intervention:   n/a  Additional Comments:   Did not attend  Pattie Flaharty M Anginette Espejo 06/25/2023, 1:23 PM

## 2023-06-25 NOTE — Plan of Care (Signed)
   Problem: Education: Goal: Emotional status will improve Outcome: Progressing Goal: Mental status will improve Outcome: Progressing Goal: Verbalization of understanding the information provided will improve Outcome: Progressing

## 2023-06-25 NOTE — Progress Notes (Signed)
 Patient ID: Megan Lloyd, female   DOB: 1976/04/01, 48 y.o.   MRN: [id number] Met with Pt on the unit and made her abreast to the discharge plan. She is in agreeance and remains motivated to engage in necessary services post discharge.   CSW team will continue to follow.    Mckinley Jewel, LCSW 06/25/23 3:59 PM

## 2023-06-25 NOTE — Group Note (Signed)
 Date:  06/25/2023 Time:  9:49 PM  Group Topic/Focus:  AA    Participation Level:  Minimal  Participation Quality:  Appropriate  Affect:  Appropriate  Cognitive:  Appropriate  Insight: Appropriate  Engagement in Group:  Engaged  Modes of Intervention:  Education  Additional Comments:  Patient attended  AA group  Scot Dock 06/25/2023, 9:49 PM

## 2023-06-25 NOTE — Group Note (Signed)
 Date:  06/25/2023 Time:  9:59 AM  Group Topic/Focus:  Goals Group:   The focus of this group is to help patients establish daily goals to achieve during treatment and discuss how the patient can incorporate goal setting into their daily lives to aide in recovery. Orientation:   The focus of this group is to educate the patient on the purpose and policies of crisis stabilization and provide a format to answer questions about their admission.  The group details unit policies and expectations of patients while admitted.    Participation Level:  Did Not Attend  Participation Quality:   n/a  Affect:   n/a  Cognitive:   n/a  Insight: None  Engagement in Group:   n/a  Modes of Intervention:   n/a  Additional Comments:   Did not attend.  Edmund Hilda Tecora Eustache 06/25/2023, 9:59 AM

## 2023-06-25 NOTE — Plan of Care (Signed)
  Problem: Education: Goal: Emotional status will improve Outcome: Progressing   Problem: Activity: Goal: Interest or engagement in activities will improve Outcome: Progressing   Problem: Coping: Goal: Ability to verbalize frustrations and anger appropriately will improve Outcome: Progressing   Problem: Safety: Goal: Periods of time without injury will increase Outcome: Progressing
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
## 2023-06-26 DIAGNOSIS — F332 Major depressive disorder, recurrent severe without psychotic features: Secondary | ICD-10-CM | POA: Diagnosis not present

## 2023-06-26 MED ORDER — PRAZOSIN HCL 2 MG PO CAPS
2.0000 mg | ORAL_CAPSULE | Freq: Every day | ORAL | Status: DC
  Administered 2023-06-26 – 2023-06-27 (×2): 2 mg via ORAL
  Filled 2023-06-26: qty 2
  Filled 2023-06-26 (×3): qty 1

## 2023-06-26 MED ORDER — HYDROXYZINE HCL 50 MG PO TABS
50.0000 mg | ORAL_TABLET | Freq: Three times a day (TID) | ORAL | Status: DC | PRN
  Filled 2023-06-26: qty 1

## 2023-06-26 MED ORDER — ESCITALOPRAM OXALATE 10 MG PO TABS
10.0000 mg | ORAL_TABLET | Freq: Every day | ORAL | Status: DC
  Administered 2023-06-26 – 2023-06-27 (×2): 10 mg via ORAL
  Filled 2023-06-26 (×4): qty 1

## 2023-06-26 NOTE — Group Note (Signed)
  [facility] LCSW Group Therapy Note    Type of Therapy and Topic:  Group Therapy:  Identifying Triggers and Coping Mechanisms  Participation Level:  Active   Description of Group This process group involved patients discussing what how to identify triggers and what coping skills they can use.  These necessary actions were discussed in detail, including why they are important and methods to be used to ensure they are continued.  The group brainstormed together other possible coping skills that would benefit patients by being continued after discharge.  Therapeutic Goals Patients will identify and describe behavioral triggers and coping mechanisms. Patients will verbalize benefits of these actions Patients will describe specifically how they plan to keep these habits active Patients will brainstorm other necessary actions that can produce positive benefits in the outpatient setting  Summary of Patient Progress:  The patient expressed one thing, journaling, that can continue after hospital discharge that will help her stay well is.  The way in which this can be continued is daily practice.    Therapeutic Modalities Cognitive Behavioral Therapy Motivational Interviewing   Azucena Kuba, MSW, LCSWA

## 2023-06-26 NOTE — Group Note (Signed)
 Date:  06/26/2023 Time:  9:25 AM  Group Topic/Focus:  Coping With Mental Health Crisis:   The purpose of this group is to help patients identify strategies for coping with mental health crisis.  Group discusses possible causes of crisis and ways to manage them effectively. Goals Group:   The focus of this group is to help patients establish daily goals to achieve during treatment and discuss how the patient can incorporate goal setting into their daily lives to aide in recovery. Orientation:   The focus of this group is to educate the patient on the purpose and policies of crisis stabilization and provide a format to answer questions about their admission.  The group details unit policies and expectations of patients while admitted.    Participation Level:  Did Not Attend  Participation Quality:   n/a  Affect:   n/a  Cognitive:   n/a  Insight: None  Engagement in Group:   n/a  Modes of Intervention:   n/a  Additional Comments:  n/a  Dimas Chyle Jordana Dugue 06/26/2023, 9:25 AM

## 2023-06-26 NOTE — Progress Notes (Addendum)
 Patient denies SI/HI/AVH this morning. Pt rates her depression a 8/10 and anxiety a 8/10. Pt reports that she slept "poor" last night due to nightmares. Pt has been interactive on the unit and participating in groups throughout the day. Pt has been calm and cooperative throughout the day. Patient has been compliant with medications and treatment plan. Q 15 minute safety checks are in place for patient's safety. Patient is currently safe on the unit.   06/26/23 0819  Psych Admission Type (Psych Patients Only)  Admission Status Involuntary  Psychosocial Assessment  Patient Complaints Anxiety;Depression;Sleep disturbance (Anxiety and depression 8/10)  Eye Contact Fair  Facial Expression Animated  Affect Appropriate to circumstance  Speech Logical/coherent  Interaction Assertive  Motor Activity Slow  Appearance/Hygiene Unremarkable  Behavior Characteristics Appropriate to situation  Mood Anxious;Depressed  Thought Process  Coherency WDL  Content WDL  Delusions None reported or observed  Perception WDL  Hallucination None reported or observed  Judgment Impaired  Confusion None  Danger to Self  Current suicidal ideation? Denies  Description of Suicide Plan No plan  Self-Injurious Behavior No self-injurious ideation or behavior indicators observed or expressed   Agreement Not to Harm Self Yes  Description of Agreement Verbal  Danger to Others  Danger to Others None reported or observed

## 2023-06-26 NOTE — [facility] Group Notes (Signed)
 [facility] Group Notes:  (Nursing/MHT/Case Management/Adjunct)  Date:  06/26/2023  Time:  2000  Type of Therapy:   Wrap up group  Participation Level:  Active  Participation Quality:  Appropriate, Attentive, Sharing, and Supportive  Affect:  Appropriate and Tearful  Cognitive:  Alert  Insight:  Improving  Engagement in Group:  Engaged  Modes of Intervention:  Clarification, Education, Socialization, and Support  Summary of Progress/Problems: Positive thinking and positive change were discussed.   Johann Capers S 06/26/2023, 9:07 PM
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
## 2023-06-26 NOTE — Plan of Care (Signed)
   Problem: Education: Goal: Emotional status will improve Outcome: Progressing Goal: Mental status will improve Outcome: Progressing   Problem: Activity: Goal: Interest or engagement in activities will improve Outcome: Progressing Goal: Sleeping patterns will improve Outcome: Progressing

## 2023-06-26 NOTE — Group Note (Signed)
 Date:  06/26/2023 Time:  4:21 PM  Group Topic/Focus:  Building Self Esteem:   The Focus of this group is helping patients become aware of the effects of self-esteem on their lives, the things they and others do that enhance or undermine their self-esteem, seeing the relationship between their level of self-esteem and the choices they make and learning ways to enhance self-esteem.    Participation Level:  Active  Participation Quality:  Appropriate  Affect:  Appropriate  Cognitive:  Appropriate  Insight: Good  Engagement in Group:  Engaged  Modes of Intervention:  Activity  Additional Comments:    Azalee Course 06/26/2023, 4:21 PM

## 2023-06-27 DIAGNOSIS — F332 Major depressive disorder, recurrent severe without psychotic features: Secondary | ICD-10-CM | POA: Diagnosis not present

## 2023-06-27 MED ORDER — ONDANSETRON 4 MG PO TBDP: 4.0000 mg | ORAL_TABLET | Freq: Three times a day (TID) | ORAL | 0 refills | Status: AC | PRN

## 2023-06-27 MED ORDER — PRAZOSIN HCL 2 MG PO CAPS: 2.0000 mg | ORAL_CAPSULE | Freq: Every day | ORAL | 0 refills | Status: DC

## 2023-06-27 MED ORDER — FLEET ENEMA RE ENEM
1.0000 | ENEMA | Freq: Once | RECTAL | Status: AC
  Administered 2023-06-27: 1 via RECTAL
  Filled 2023-06-27 (×2): qty 1

## 2023-06-27 MED ORDER — ARIPIPRAZOLE 5 MG PO TABS: 5.0000 mg | ORAL_TABLET | Freq: Every day | ORAL | 0 refills | Status: DC

## 2023-06-27 MED ORDER — NALTREXONE HCL 50 MG PO TABS: 50.0000 mg | ORAL_TABLET | Freq: Every day | ORAL | 0 refills | Status: DC

## 2023-06-27 MED ORDER — SENNOSIDES-DOCUSATE SODIUM 8.6-50 MG PO TABS
1.0000 | ORAL_TABLET | Freq: Every day | ORAL | Status: DC
  Administered 2023-06-27 – 2023-06-28 (×2): 1 via ORAL
  Filled 2023-06-27 (×4): qty 1

## 2023-06-27 MED ORDER — POLYETHYLENE GLYCOL 3350 17 G PO PACK
17.0000 g | PACK | Freq: Every day | ORAL | Status: DC
  Administered 2023-06-27: 17 g via ORAL
  Filled 2023-06-27 (×3): qty 1

## 2023-06-27 MED ORDER — GABAPENTIN 300 MG PO CAPS: 300.0000 mg | ORAL_CAPSULE | Freq: Three times a day (TID) | ORAL | 0 refills | Status: DC

## 2023-06-27 MED ORDER — NICOTINE 14 MG/24HR TD PT24: 14.0000 mg | MEDICATED_PATCH | Freq: Every day | TRANSDERMAL | 0 refills | Status: DC

## 2023-06-27 MED ORDER — PANTOPRAZOLE SODIUM 40 MG PO TBEC: 40.0000 mg | DELAYED_RELEASE_TABLET | Freq: Every day | ORAL | 0 refills | Status: DC

## 2023-06-27 MED ORDER — ESCITALOPRAM OXALATE 10 MG PO TABS: 10.0000 mg | ORAL_TABLET | Freq: Every day | ORAL | 0 refills | Status: DC

## 2023-06-27 MED ORDER — SENNOSIDES-DOCUSATE SODIUM 8.6-50 MG PO TABS: 1.0000 | ORAL_TABLET | Freq: Every day | ORAL | 0 refills | Status: AC

## 2023-06-27 MED ORDER — HYDROXYZINE HCL 50 MG PO TABS
50.0000 mg | ORAL_TABLET | Freq: Three times a day (TID) | ORAL | 0 refills | Status: DC | PRN
Start: 2023-06-27 — End: 2023-07-06

## 2023-06-27 NOTE — Progress Notes (Signed)
 Pt uncomfortable and tearful when she woke up. Pt verbalizing she has not had a bowel movement in five days. Pt asked about the diarrhea she had six days ago. She stated she is now feeling the opposite. She also stated she had an episode of emesis this morning.   Provider paged and saline enema was order. Patient was given the enema and she verbalized she think she did not try to hold it in long enough and it did not work. But she also stated that her stool was green. Pt was asked to clarify if she had a bowel movement or did not have a bowel movement and she stated a little bit of stool came out. She described stool as soft and green.   She continues to verbalized discomfort.
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
## 2023-06-27 NOTE — Plan of Care (Signed)
   Problem: Education: Goal: Emotional status will improve Outcome: Progressing Goal: Mental status will improve Outcome: Progressing Goal: Verbalization of understanding the information provided will improve Outcome: Progressing

## 2023-06-27 NOTE — [facility] Group Notes (Signed)
 Adult Psychoeducational Group Note  Date:  06/27/2023 Time:  10:39 PM  Group Topic/Focus:  Wrap-Up Group:   The focus of this group is to help patients review their daily goal of treatment and discuss progress on daily workbooks.  Participation Level:  Did Not Attend  Christ Kick 06/27/2023, 10:39 PM

## 2023-06-27 NOTE — Progress Notes (Signed)
 D: Patient is alert, oriented, pleasant, and cooperative. Denies SI, HI, AVH, and verbally contracts for safety. Patient reports she slept fair last night without sleeping medication. Patient reports her appetite as poor, energy level as low, and concentration as poor. Patient rates her depression 3/10, hopelessness 0/10, and anxiety 5/10. Patient reports vomiting and constipation.    A: Scheduled medications administered per MD order. PRN zofran administered and was effective. Support provided. Patient educated on safety on the unit and medications. Routine safety checks every 15 minutes. Patient stated understanding to tell nurse about any new physical symptoms. Patient understands to tell staff of any needs.     R: No adverse drug reactions noted. Patient remains safe at this time and will continue to monitor.    06/27/23 0900  Psych Admission Type (Psych Patients Only)  Admission Status Involuntary  Psychosocial Assessment  Patient Complaints Anxiety;Depression  Eye Contact Fair  Facial Expression Animated;Pained  Affect Appropriate to circumstance  Speech Logical/coherent  Interaction Assertive  Motor Activity Other (Comment) (WNL)  Appearance/Hygiene Unremarkable  Behavior Characteristics Cooperative;Appropriate to situation  Mood Anxious;Depressed;Pleasant  Thought Process  Coherency WDL  Content WDL  Delusions None reported or observed  Perception WDL  Hallucination None reported or observed  Judgment Limited  Confusion None  Danger to Self  Current suicidal ideation? Denies  Self-Injurious Behavior No self-injurious ideation or behavior indicators observed or expressed   Agreement Not to Harm Self Yes  Description of Agreement verbal  Danger to Others  Danger to Others None reported or observed
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
## 2023-06-27 NOTE — Group Note (Signed)
 Date:  06/27/2023 Time:  8:54 AM  Group Topic/Focus:  Goals Group:   The focus of this group is to help patients establish daily goals to achieve during treatment and discuss how the patient can incorporate goal setting into their daily lives to aide in recovery. Orientation:   The focus of this group is to educate the patient on the purpose and policies of crisis stabilization and provide a format to answer questions about their admission.  The group details unit policies and expectations of patients while admitted.    Participation Level:  Did Not Attend  Participation Quality:   n/a  Affect:   n/a  Cognitive:   n/a  Insight: None  Engagement in Group:   n/a  Modes of Intervention:   n/a  Additional Comments:    Azalee Course 06/27/2023, 8:54 AM

## 2023-06-27 NOTE — Plan of Care (Signed)
  Problem: Health Behavior/Discharge Planning: Goal: Compliance with treatment plan for underlying cause of condition will improve Outcome: Progressing   Problem: Physical Regulation: Goal: Ability to maintain clinical measurements within normal limits will improve Outcome: Progressing   Problem: Safety: Goal: Periods of time without injury will increase Outcome: Progressing   
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
## 2023-06-27 NOTE — Plan of Care (Signed)
   Problem: Education: Goal: Emotional status will improve Outcome: Progressing Goal: Mental status will improve Outcome: Progressing Goal: Verbalization of understanding the information provided will improve Outcome: Progressing   Problem: Activity: Goal: Interest or engagement in activities will improve Outcome: Progressing   Problem: Coping: Goal: Ability to demonstrate self-control will improve Outcome: Progressing   Problem: Safety: Goal: Periods of time without injury will increase Outcome: Progressing

## 2023-06-27 NOTE — Progress Notes (Signed)
   06/27/23 0557  15 Minute Checks  Location Bedroom  Visual Appearance Calm  Behavior Sleeping  Sleep (Behavioral Health Patients Only)  Calculate sleep? (Click Yes once per 24 hr at 0600 safety check) Yes  Documented sleep last 24 hours 8.5

## 2023-06-28 ENCOUNTER — Other Ambulatory Visit (HOSPITAL_COMMUNITY): Payer: Self-pay

## 2023-06-28 NOTE — Plan of Care (Signed)
  Problem: Education: Goal: Emotional status will improve Outcome: Completed/Met Goal: Mental status will improve 06/28/2023 0932 by Thersa Salt, RN Outcome: Adequate for Discharge 06/28/2023 0932 by Thersa Salt, RN Outcome: Progressing Goal: Verbalization of understanding the information provided will improve Outcome: Completed/Met

## 2023-06-28 NOTE — Progress Notes (Signed)
   06/28/23 0900  Psych Admission Type (Psych Patients Only)  Admission Status Involuntary  Psychosocial Assessment  Patient Complaints Anxiety  Eye Contact Fair  Facial Expression Animated  Affect Appropriate to circumstance  Speech Logical/coherent  Interaction Assertive  Motor Activity Fidgety  Appearance/Hygiene Unremarkable  Behavior Characteristics Cooperative;Appropriate to situation  Mood Pleasant  Thought Process  Coherency WDL  Content Blaming others  Delusions None reported or observed  Perception WDL  Hallucination None reported or observed  Judgment Poor  Confusion None  Danger to Self  Current suicidal ideation? Denies  Agreement Not to Harm Self Yes  Description of Agreement verbal  Danger to Others  Danger to Others None reported or observed

## 2023-06-28 NOTE — Progress Notes (Signed)
 [facility] Adult Case Management Discharge Plan :  Will you be returning to the same living situation after discharge:  No.Pt will be going to sister's home. At discharge, do you have transportation home?: Yes,  daughter to provide Do you have the ability to pay for your medications: Yes,  active Medicaid  Release of information consent forms completed and in the chart;  Patient's signature needed at discharge.  Patient to Follow up at:  Follow-up Information     [facility] Follow up on 06/29/2023.   Specialty: Behavioral Health Why: Your next appointment for therapy services is on 06/29/23 at 3:00 pm.  You also have an appointment for medication management services scheduled for 07/06/23 at 3:00 pm. Contact information: [phone] [place] [address] [phone]                Next level of care provider has access to [facility] Link:yes  Safety Planning and Suicide Prevention discussed: Yes,  completed w/ Pt's daughter [name]  Has patient been referred to the Quitline?: Patient refused referral for treatment  Patient has been referred for addiction treatment: Yes, the patient will follow up with an outpatient provider for substance use disorder. Psychiatrist/APP: appointment made and Therapist: appointment made  Jacinta Shoe, LCSW 06/28/2023, 8:50 AM

## 2023-06-28 NOTE — Plan of Care (Signed)
  Problem: Education: Goal: Emotional status will improve Outcome: Completed/Met Goal: Mental status will improve Outcome: Progressing Goal: Verbalization of understanding the information provided will improve Outcome: Completed/Met

## 2023-06-28 NOTE — Progress Notes (Signed)
   06/27/23 1637 06/27/23 2002  Vital Signs  Temp  --  98.6 F (37 C)  Pulse Rate 91 98  Pulse Rate Source  --  Monitor  BP (!) 147/99 (!) 159/107  BP Location Right Arm Left Arm  BP Method Automatic Automatic  Patient Position (if appropriate) Sitting Standing   Patient has been labile and irritable this evening when she saw a Peer being agitated. She was getting her v/s checked and started yelling at the [facility] stating you touching my arm. Patient required lots of redirection to be calm Prn Haldol 5 mg given and Benadryl 50 mg Per Patient request. Clonidine also given for elevated BP. Patient is in bed sleeping respirations noted. Support and encouragement provided.

## 2023-06-28 NOTE — Progress Notes (Signed)
 Patient ID: Megan Lloyd, female   DOB: 01/26/1976, 48 y.o.   MRN: [id number] Discharge instructions, medications and follow up appointments reviewed with patient, she verbalized understaffing. Belongings returned to patient, discharged home.

## 2023-06-29 ENCOUNTER — Ambulatory Visit (HOSPITAL_COMMUNITY): Payer: Medicaid Other

## 2023-07-06 ENCOUNTER — Ambulatory Visit (INDEPENDENT_AMBULATORY_CARE_PROVIDER_SITE_OTHER): Admitting: Student

## 2023-07-06 ENCOUNTER — Ambulatory Visit (HOSPITAL_COMMUNITY): Payer: Medicaid Other

## 2023-07-06 ENCOUNTER — Encounter (HOSPITAL_COMMUNITY): Payer: Self-pay | Admitting: Student

## 2023-07-06 VITALS — BP 145/96 | HR 67 | Ht 68.0 in | Wt 202.8 lb

## 2023-07-06 DIAGNOSIS — F4312 Post-traumatic stress disorder, chronic: Secondary | ICD-10-CM

## 2023-07-06 DIAGNOSIS — F1591 Other stimulant use, unspecified, in remission: Secondary | ICD-10-CM

## 2023-07-06 DIAGNOSIS — F411 Generalized anxiety disorder: Secondary | ICD-10-CM

## 2023-07-06 DIAGNOSIS — G479 Sleep disorder, unspecified: Secondary | ICD-10-CM

## 2023-07-06 DIAGNOSIS — Z8659 Personal history of other mental and behavioral disorders: Secondary | ICD-10-CM | POA: Diagnosis not present

## 2023-07-06 DIAGNOSIS — F1020 Alcohol dependence, uncomplicated: Secondary | ICD-10-CM

## 2023-07-06 DIAGNOSIS — R030 Elevated blood-pressure reading, without diagnosis of hypertension: Secondary | ICD-10-CM

## 2023-07-06 DIAGNOSIS — F1421 Cocaine dependence, in remission: Secondary | ICD-10-CM

## 2023-07-06 DIAGNOSIS — F1521 Other stimulant dependence, in remission: Secondary | ICD-10-CM

## 2023-07-06 DIAGNOSIS — K219 Gastro-esophageal reflux disease without esophagitis: Secondary | ICD-10-CM

## 2023-07-06 DIAGNOSIS — F1290 Cannabis use, unspecified, uncomplicated: Secondary | ICD-10-CM

## 2023-07-06 MED ORDER — ARIPIPRAZOLE 10 MG PO TABS: 10.0000 mg | ORAL_TABLET | Freq: Every day | ORAL | 2 refills | Status: AC

## 2023-07-06 MED ORDER — PANTOPRAZOLE SODIUM 40 MG PO TBEC: 40.0000 mg | DELAYED_RELEASE_TABLET | Freq: Every day | ORAL | 2 refills | Status: AC

## 2023-07-06 MED ORDER — ESCITALOPRAM OXALATE 10 MG PO TABS: 10.0000 mg | ORAL_TABLET | Freq: Every day | ORAL | 2 refills | Status: AC

## 2023-07-06 MED ORDER — PRAZOSIN HCL 2 MG PO CAPS: 2.0000 mg | ORAL_CAPSULE | Freq: Every day | ORAL | 2 refills | Status: AC

## 2023-07-06 MED ORDER — NALTREXONE HCL 50 MG PO TABS: 100.0000 mg | ORAL_TABLET | Freq: Every day | ORAL | 2 refills | Status: AC

## 2023-07-06 MED ORDER — GABAPENTIN 300 MG PO CAPS: 300.0000 mg | ORAL_CAPSULE | Freq: Three times a day (TID) | ORAL | 2 refills | Status: AC
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
